# Patient Record
Sex: Male | Born: 1935 | Race: White | Hispanic: No | Marital: Single | Smoking: Never smoker
Health system: Southern US, Community
[De-identification: ages and names within clinical notes are randomized; demographics above are authoritative.]

## PROBLEM LIST (undated history)

## (undated) DIAGNOSIS — K859 Acute pancreatitis without necrosis or infection, unspecified: Secondary | ICD-10-CM

## (undated) DIAGNOSIS — I2510 Atherosclerotic heart disease of native coronary artery without angina pectoris: Secondary | ICD-10-CM

## (undated) DIAGNOSIS — I10 Essential (primary) hypertension: Secondary | ICD-10-CM

## (undated) DIAGNOSIS — E1129 Type 2 diabetes mellitus with other diabetic kidney complication: Secondary | ICD-10-CM

## (undated) DIAGNOSIS — N184 Chronic kidney disease, stage 4 (severe): Secondary | ICD-10-CM

## (undated) DIAGNOSIS — E785 Hyperlipidemia, unspecified: Secondary | ICD-10-CM

## (undated) DIAGNOSIS — D649 Anemia, unspecified: Secondary | ICD-10-CM

## (undated) HISTORY — PX: CHOLECYSTECTOMY: SHX55

## (undated) HISTORY — DX: Acute pancreatitis without necrosis or infection, unspecified: K85.90

## (undated) HISTORY — PX: ANGIOPLASTY: SHX39

## (undated) HISTORY — PX: APPENDECTOMY: SHX54

## (undated) HISTORY — DX: Essential (primary) hypertension: I10

## (undated) HISTORY — DX: Anemia, unspecified: D64.9

## (undated) HISTORY — DX: Hyperlipidemia, unspecified: E78.5

## (undated) HISTORY — DX: Atherosclerotic heart disease of native coronary artery without angina pectoris: I25.10

## (undated) HISTORY — PX: URETERAL STENT PLACEMENT: SHX822

---

## 1998-11-10 ENCOUNTER — Ambulatory Visit (HOSPITAL_COMMUNITY): Admission: RE | Admit: 1998-11-10 | Discharge: 1998-11-10 | Payer: Self-pay | Admitting: Internal Medicine

## 1999-11-09 ENCOUNTER — Ambulatory Visit (HOSPITAL_COMMUNITY): Admission: RE | Admit: 1999-11-09 | Discharge: 1999-11-09 | Payer: Self-pay | Admitting: Internal Medicine

## 2000-11-28 ENCOUNTER — Ambulatory Visit (HOSPITAL_COMMUNITY): Admission: RE | Admit: 2000-11-28 | Discharge: 2000-11-28 | Payer: Self-pay | Admitting: Internal Medicine

## 2001-11-29 ENCOUNTER — Emergency Department (HOSPITAL_COMMUNITY): Admission: EM | Admit: 2001-11-29 | Discharge: 2001-11-29 | Payer: Self-pay | Admitting: Emergency Medicine

## 2001-12-18 ENCOUNTER — Ambulatory Visit (HOSPITAL_COMMUNITY): Admission: RE | Admit: 2001-12-18 | Discharge: 2001-12-18 | Payer: Self-pay | Admitting: Internal Medicine

## 2003-01-14 ENCOUNTER — Ambulatory Visit (HOSPITAL_COMMUNITY): Admission: RE | Admit: 2003-01-14 | Discharge: 2003-01-14 | Payer: Self-pay | Admitting: Internal Medicine

## 2004-06-05 ENCOUNTER — Ambulatory Visit (HOSPITAL_COMMUNITY): Admission: RE | Admit: 2004-06-05 | Discharge: 2004-06-05 | Payer: Self-pay | Admitting: Internal Medicine

## 2004-06-19 ENCOUNTER — Inpatient Hospital Stay (HOSPITAL_COMMUNITY): Admission: EM | Admit: 2004-06-19 | Discharge: 2004-06-26 | Payer: Self-pay | Admitting: Emergency Medicine

## 2004-06-24 ENCOUNTER — Encounter (INDEPENDENT_AMBULATORY_CARE_PROVIDER_SITE_OTHER): Payer: Self-pay | Admitting: *Deleted

## 2005-06-14 ENCOUNTER — Ambulatory Visit (HOSPITAL_COMMUNITY): Admission: RE | Admit: 2005-06-14 | Discharge: 2005-06-14 | Payer: Self-pay | Admitting: Internal Medicine

## 2006-06-13 ENCOUNTER — Ambulatory Visit (HOSPITAL_COMMUNITY): Admission: RE | Admit: 2006-06-13 | Discharge: 2006-06-13 | Payer: Self-pay | Admitting: Internal Medicine

## 2008-10-13 ENCOUNTER — Emergency Department (HOSPITAL_COMMUNITY): Admission: EM | Admit: 2008-10-13 | Discharge: 2008-10-13 | Payer: Self-pay | Admitting: Emergency Medicine

## 2010-09-15 ENCOUNTER — Inpatient Hospital Stay (HOSPITAL_COMMUNITY)
Admission: EM | Admit: 2010-09-15 | Discharge: 2010-09-18 | Disposition: A | Discharge status: Home / Self Care | Payer: Self-pay | Source: Home / Self Care | Attending: Internal Medicine | Admitting: Internal Medicine

## 2010-09-15 LAB — CK TOTAL AND CKMB (NOT AT ARMC)
CK, MB: 1.1 ng/mL (ref 0.3–4.0)
Relative Index: INVALID (ref 0.0–2.5)
Total CK: 26 U/L (ref 7–232)

## 2010-09-15 LAB — DIFFERENTIAL
Basophils Absolute: 0 10*3/uL (ref 0.0–0.1)
Basophils Relative: 0 % (ref 0–1)
Eosinophils Absolute: 0 10*3/uL (ref 0.0–0.7)
Eosinophils Relative: 0 % (ref 0–5)
Lymphocytes Relative: 6 % — ABNORMAL LOW (ref 12–46)
Lymphs Abs: 0.7 10*3/uL (ref 0.7–4.0)
Monocytes Absolute: 0.6 10*3/uL (ref 0.1–1.0)
Monocytes Relative: 5 % (ref 3–12)
Neutro Abs: 10.6 10*3/uL — ABNORMAL HIGH (ref 1.7–7.7)
Neutrophils Relative %: 90 % — ABNORMAL HIGH (ref 43–77)

## 2010-09-15 LAB — COMPREHENSIVE METABOLIC PANEL
ALT: 108 U/L — ABNORMAL HIGH (ref 0–53)
AST: 171 U/L — ABNORMAL HIGH (ref 0–37)
Albumin: 3.9 g/dL (ref 3.5–5.2)
Alkaline Phosphatase: 147 U/L — ABNORMAL HIGH (ref 39–117)
BUN: 21 mg/dL (ref 6–23)
CO2: 26 mEq/L (ref 19–32)
Calcium: 9 mg/dL (ref 8.4–10.5)
Chloride: 100 mEq/L (ref 96–112)
Creatinine, Ser: 1.38 mg/dL (ref 0.4–1.5)
GFR calc Af Amer: 59 mL/min — ABNORMAL LOW (ref >60)
GFR calc non Af Amer: 49 mL/min — ABNORMAL LOW (ref >60)
Glucose, Bld: 245 mg/dL — ABNORMAL HIGH (ref 70–99)
Potassium: 4.2 mEq/L (ref 3.5–5.1)
Sodium: 136 mEq/L (ref 135–145)
Total Bilirubin: 0.8 mg/dL (ref 0.3–1.2)
Total Protein: 8.1 g/dL (ref 6.0–8.3)

## 2010-09-15 LAB — HEMOGLOBIN A1C
Hgb A1c MFr Bld: 5.7 % — ABNORMAL HIGH (ref <5.7)
Mean Plasma Glucose: 117 mg/dL — ABNORMAL HIGH (ref <117)

## 2010-09-15 LAB — PHOSPHORUS: Phosphorus: 3 mg/dL (ref 2.3–4.6)

## 2010-09-15 LAB — LIPID PANEL
Cholesterol: 93 mg/dL (ref 0–200)
HDL: 44 mg/dL (ref >39)
LDL Cholesterol: 40 mg/dL (ref 0–99)
Total CHOL/HDL Ratio: 2.1 RATIO
Triglycerides: 46 mg/dL (ref <150)
VLDL: 9 mg/dL (ref 0–40)

## 2010-09-15 LAB — CBC
HCT: 40.2 % (ref 39.0–52.0)
Hemoglobin: 13.7 g/dL (ref 13.0–17.0)
MCH: 34.7 pg — ABNORMAL HIGH (ref 26.0–34.0)
MCHC: 34.1 g/dL (ref 30.0–36.0)
MCV: 101.8 fL — ABNORMAL HIGH (ref 78.0–100.0)
Platelets: 195 10*3/uL (ref 150–400)
RBC: 3.95 MIL/uL — ABNORMAL LOW (ref 4.22–5.81)
RDW: 14.5 % (ref 11.5–15.5)
WBC: 11.9 10*3/uL — ABNORMAL HIGH (ref 4.0–10.5)

## 2010-09-15 LAB — LIPASE, BLOOD: Lipase: 1339 U/L — ABNORMAL HIGH (ref 11–59)

## 2010-09-15 LAB — TSH: TSH: 3.656 u[IU]/mL (ref 0.350–4.500)

## 2010-09-15 LAB — APTT: aPTT: 30 seconds (ref 24–37)

## 2010-09-15 LAB — POCT CARDIAC MARKERS
CKMB, poc: <1 ng/mL — ABNORMAL LOW (ref 1.0–8.0)
Myoglobin, poc: 93 ng/mL (ref 12–200)
Troponin i, poc: <0.05 ng/mL (ref 0.00–0.09)

## 2010-09-15 LAB — GLUCOSE, CAPILLARY
Glucose-Capillary: 179 mg/dL — ABNORMAL HIGH (ref 70–99)
Glucose-Capillary: 99 mg/dL (ref 70–99)
Glucose-Capillary: 68 mg/dL — ABNORMAL LOW (ref 70–99)
Glucose-Capillary: 81 mg/dL (ref 70–99)

## 2010-09-15 LAB — PROTIME-INR
INR: 1.15 (ref 0.00–1.49)
Prothrombin Time: 14.9 seconds (ref 11.6–15.2)

## 2010-09-15 LAB — MAGNESIUM: Magnesium: 2.3 mg/dL (ref 1.5–2.5)

## 2010-09-16 LAB — COMPREHENSIVE METABOLIC PANEL
Alkaline Phosphatase: 121 U/L — ABNORMAL HIGH (ref 39–117)
BUN: 13 mg/dL (ref 6–23)
CO2: 26 mEq/L (ref 19–32)
GFR calc non Af Amer: 48 mL/min — ABNORMAL LOW (ref >60)
Glucose, Bld: 99 mg/dL (ref 70–99)
Potassium: 3.9 mEq/L (ref 3.5–5.1)
Total Bilirubin: 0.8 mg/dL (ref 0.3–1.2)
Total Protein: 6.7 g/dL (ref 6.0–8.3)

## 2010-09-16 LAB — GLUCOSE, CAPILLARY
Glucose-Capillary: 105 mg/dL — ABNORMAL HIGH (ref 70–99)
Glucose-Capillary: 83 mg/dL (ref 70–99)
Glucose-Capillary: 118 mg/dL — ABNORMAL HIGH (ref 70–99)

## 2010-09-16 LAB — LIPASE, BLOOD: Lipase: 98 U/L — ABNORMAL HIGH (ref 11–59)

## 2010-09-17 LAB — COMPREHENSIVE METABOLIC PANEL
ALT: 112 U/L — ABNORMAL HIGH (ref 0–53)
Albumin: 2.7 g/dL — ABNORMAL LOW (ref 3.5–5.2)
Alkaline Phosphatase: 122 U/L — ABNORMAL HIGH (ref 39–117)
Calcium: 8.3 mg/dL — ABNORMAL LOW (ref 8.4–10.5)
GFR calc Af Amer: 52 mL/min — ABNORMAL LOW (ref >60)
Potassium: 3.8 mEq/L (ref 3.5–5.1)
Sodium: 135 mEq/L (ref 135–145)
Total Protein: 6.2 g/dL (ref 6.0–8.3)

## 2010-09-17 LAB — GLUCOSE, CAPILLARY
Glucose-Capillary: 130 mg/dL — ABNORMAL HIGH (ref 70–99)
Glucose-Capillary: 140 mg/dL — ABNORMAL HIGH (ref 70–99)
Glucose-Capillary: 157 mg/dL — ABNORMAL HIGH (ref 70–99)
Glucose-Capillary: 224 mg/dL — ABNORMAL HIGH (ref 70–99)
Glucose-Capillary: 247 mg/dL — ABNORMAL HIGH (ref 70–99)

## 2010-09-18 LAB — BASIC METABOLIC PANEL
BUN: 13 mg/dL (ref 6–23)
Chloride: 106 mEq/L (ref 96–112)
Creatinine, Ser: 1.43 mg/dL (ref 0.4–1.5)
GFR calc non Af Amer: 47 mL/min — ABNORMAL LOW (ref >60)
Glucose, Bld: 115 mg/dL — ABNORMAL HIGH (ref 70–99)
Potassium: 3.8 mEq/L (ref 3.5–5.1)

## 2010-09-18 LAB — GLUCOSE, CAPILLARY
Glucose-Capillary: 126 mg/dL — ABNORMAL HIGH (ref 70–99)
Glucose-Capillary: 154 mg/dL — ABNORMAL HIGH (ref 70–99)

## 2010-09-26 NOTE — Discharge Summary (Signed)
NAMECALYB, [name] Flynn             ACCOUNT NO.:  [id number]  MEDICAL RECORD NO.:  [id number]          PATIENT TYPE:  INP  LOCATION:  1517                         FACILITY:  [facility]  PHYSICIAN:  Clydia Llano, MD       DATE OF BIRTH:  1924/04/21  DATE OF ADMISSION:  09/15/2010 DATE OF DISCHARGE:                              DISCHARGE SUMMARY   PRIMARY CARE PHYSICIAN:  [name], M.D.  REASON FOR ADMISSION:  Abdominal pain.  DISCHARGE DIAGNOSES: 1. Acute pancreatitis. 2. Left renal mass, probably transitional cell carcinoma. 3. Hypertension. 4. Diabetes mellitus. 5. Gouty arthritis. 6. Hyperlipidemia. 7. Status post cholecystectomy secondary to gallstones in 2005.  RADIOLOGY: 1. CT abdomen and pelvis, January 28, showed findings compatible with     acute pancreatitis.  Abnormal appearance of the left renal     collecting system with abnormal soft tissue tumor which had to be     considered malignancy until tell proven otherwise.  The     differential diagnoses are transitional cell carcinoma, renal cell     carcinoma and lymphoma are felt to be less likely. 2. Chest x-ray showed hyperexpansion without acute findings.  DISCHARGE MEDICATIONS: 1. Allopurinol 300 mg p.o. daily. 2. Aspirin enteric coated 325 mg every other day. 3. Atenolol 50 mg p.o. daily. 4. Colcrys 0.6 mg p.o. daily. 5. Crestor 10 mg p.o. daily. 6. Glimepiride 2 mg p.o. daily. 7. Metamucil powder 1 tablespoon p.o. daily as needed for     constipation. 8. Multivitamin 1 tablet p.o. daily.  CONSULTS:  [facility], [name], MD  BRIEF HISTORY EXAMINATION:  [name] Flynn is 77 year old gentleman with past medical history of acute pancreatitis in 2005, which was thought to be secondary to gallstones.  He had cholecystectomy in November 2005. The patient was doing fairly well until the day of admission when he had episode of sharp abdominal pain.  He thought this was related to him straining his  muscle when he had to pick his wife off the floor.  The patient lives in independent living facility at [facility].  His abdominal pain was getting worse and patient developed nausea, vomiting. The patient was brought to the emergency department for further evaluation.  His lipase was noted to be more than 1300.  Initial evaluation done by CT scan showed acute pancreatitis.  No evidence of CBD stones.  BRIEF HOSPITAL COURSE: 1. Acute pancreatitis.  The patient admitted to the hospital, was     placed on bowel rest as n.p.o.  Aggressive IV fluid hydration was     initiated.  His lipase went down nicely to 22, and his symptoms got     much better with no nausea, vomiting or abdominal pain.  Started on     clear liquids which advanced as he tolerated to solid diet.  The     patient on day of discharge was eating solid diet without any     problems.  The patient's CT scan did not show common bile duct     stones.  The patient has previous cholecystectomy.  The patient     does not  drink alcohol.  His triglycerides are 46.  Calcium is 8.3     with corrected calcium around 9.5.  Does not have any medication in     his medication profile that causes pancreatitis.  This bout of     pancreatitis is considered to be idiopathic acute pancreatitis. 2. Left renal mass, probably transitional cell carcinoma.  Urology was     called.  Dr. Laverle Patter saw the patient and asked him to follow up as     outpatient in his office. 3. Diabetes mellitus type 2.  The patient on glyburide at home.  Seems     very controlled with hemoglobin A1c 5.7.  Had no issues during the     hospitalization. 4. Hypertension, controlled.  His atenolol was continued throughout     the hospital stay. 5. Elevated LFTs.  Alkaline phosphatase 122, AST 67, ALT is 112.  This     is his LFTs from the day of discharge.  Last time, he had been     admitted to the hospital because of pancreatitis in 2005, thought     to have biliary  pancreatitis, ended up with cholecystectomy.  I     think the elevated LFTs are secondary to medication, mainly     allopurinol and colchicine.  The patient has no symptoms.  It seems     like it is elevated for as far as I can go back in the medical     records here.  DISCHARGE INSTRUCTIONS: 1. Diet, carbohydrate-modified diet. 2. Activity as tolerated. 3. Disposition home and the assisted living facility.     Clydia Llano, MD     ME/MEDQ  D:  09/18/2010  T:  09/18/2010  Job:  [id number]  cc:   [name], M.D. Fax: [phone]  [name], MD Fax: [phone]  Electronically Signed by Clydia Llano  on 09/26/2010 06:49:32 PM

## 2010-09-29 NOTE — H&P (Signed)
[name] Flynn, [name] Flynn             ACCOUNT NO.:  [id number]  MEDICAL RECORD NO.:  [id number]          PATIENT TYPE:  EMS  LOCATION:  ED                           FACILITY:  [facility]  PHYSICIAN:  Michiel Cowboy, MDDATE OF BIRTH:  [birth year]  DATE OF ADMISSION:  09/15/2010 DATE OF DISCHARGE:                             HISTORY & PHYSICAL   PRIMARY CARE PROVIDER:  [name], M.D.  CHIEF COMPLAINT:  Abdominal pain.  HISTORY:  The patient is an 77 year old gentleman with past history of pancreatitis, which thought to be gallstone related status post cholecystectomy in 2005.  He was doing fairly well up until today when he had an episode of sharp onset of abdominal pain.  He thought this was related to him straining his muscles when he had to pick up his wife off floor.  She has history of dementia and has fallen today, but the pain persisted and he developed nausea and vomiting and the pain was significant, in which point he called for help.  He residing in independent living facility at [facility].  He was brought into the emergency department.  His lipase was noted to be elevated at 1300.  CT scan is still currently pending but the [facility] hospitalist was called for an admission for presumed diagnosis of pancreatitis.  The patient denies any chest pain or shortness of breath per se.  No fevers, no chills.  He does endorse nausea and vomiting but  currently feels much better.  The pain is relieved.  PAST MEDICAL HISTORY:  Significant for; 1. Diabetes. 2. Gout. 3. Hyperlipidemia. 4. Hypertension. 5. Status post cholecystectomy secondary to gallstone or early     pancreatitis in 2005.  SOCIAL HISTORY:  The patient never smoked.  Drinks rarely.  Does not abuse drugs.  Lives at the independent living facility at [facility].  FAMILY HISTORY:  Noncontributory.  ALLERGIES:  None.  MEDICATIONS:  He is not quite sure but he thinks he takes atenolol 50 mg daily; Crestor,  unknown dose daily; aspirin 325 mg every other day; maybe a gout medicine he is not sure of; and glimepiride, he may think it is the lowest dose but he cannot remember the dose.  PHYSICAL EXAMINATION:  VITAL SIGN:  Temperature 97.7, blood pressure 210/95 and now 184/90, pulse 70, respirations 18, satting 95% on room air. GENERAL:  The patient appears to be in no acute distress. HEENT:  Head nontraumatic.  Moist mucous membranes. LUNGS:  Clear to auscultation bilaterally. HEART:  Regular rhythm with no murmurs appreciated. ABDOMEN:  There is still some epigastric tenderness present.  No right upper quadrant tenderness. LOWER EXTREMITIES:  Without clubbing, cyanosis, or edema. NEUROLOGIC:  Grossly intact. SKIN:  Clean, dry, and intact.  LABORATORY DATA:  Labs show white blood cell count 11.9.  Sodium 136, potassium 4.2, creatinine 1.4, alk phos 147, AST 171, ALT 108.  Calcium 9.0, lipase 1300.  Cardiac enzymes unremarkable.  Chest x-ray unremarkable.  EKG showing heart rate of 66, no evidence of ischemia.  ASSESSMENT/PLAN: 1. This is an 77 year old gentleman with likely pancreatitis, but also     some degree of  elevated transaminitis.  We are waiting currently     results of the CT scan of his abdomen and to evaluate if there is     any evidence of obstruction.  For now, will give IV fluids, bowel     rest.  He is already status post cholecystectomy.  If raises stone     obstructing his pancreas once, he may need to have a GI consult.     We will watch for any signs of necrotic pancreatitis.  At this     point, he does not appear to be toxic. 2. Hypertension.  This is severe likely related pain.  Will give     hydralazine p.r.n. and continue atenolol. 3. Diabetes mellitus.  Will put on sliding scale.  Hold off on p.o.     meds for now. 4. Prophylaxis.  Write for [employer] and SCDs.  CODE STATUS:  The patient wished to be full code.     Michiel Cowboy, MD     AVD/MEDQ   D:  09/15/2010  T:  09/15/2010  Job:  [id number]  cc:   [name], M.D. Fax: [phone]  Electronically Signed by Therisa Doyne MD on 09/29/2010 07:25:09 PM

## 2010-10-02 ENCOUNTER — Other Ambulatory Visit (HOSPITAL_COMMUNITY): Payer: Self-pay | Admitting: Urology

## 2010-10-02 ENCOUNTER — Ambulatory Visit (HOSPITAL_COMMUNITY)
Admission: RE | Admit: 2010-10-02 | Discharge: 2010-10-02 | Disposition: A | Discharge status: Home / Self Care | Payer: Medicare Other | Source: Ambulatory Visit | Attending: Urology | Admitting: Urology

## 2010-10-02 DIAGNOSIS — I77810 Thoracic aortic ectasia: Secondary | ICD-10-CM | POA: Insufficient documentation

## 2010-10-02 DIAGNOSIS — D499 Neoplasm of unspecified behavior of unspecified site: Secondary | ICD-10-CM

## 2010-10-02 DIAGNOSIS — D4959 Neoplasm of unspecified behavior of other genitourinary organ: Secondary | ICD-10-CM | POA: Insufficient documentation

## 2010-10-15 ENCOUNTER — Other Ambulatory Visit: Payer: Self-pay | Admitting: Urology

## 2010-10-15 ENCOUNTER — Other Ambulatory Visit (HOSPITAL_COMMUNITY): Payer: Medicare Other

## 2010-10-15 ENCOUNTER — Encounter (HOSPITAL_COMMUNITY): Payer: Medicare Other | Attending: Urology

## 2010-10-15 DIAGNOSIS — Z01812 Encounter for preprocedural laboratory examination: Secondary | ICD-10-CM | POA: Insufficient documentation

## 2010-10-15 LAB — BASIC METABOLIC PANEL
CO2: 28 mEq/L (ref 19–32)
Chloride: 105 mEq/L (ref 96–112)
GFR calc Af Amer: 55 mL/min — ABNORMAL LOW (ref >60)
Glucose, Bld: 112 mg/dL — ABNORMAL HIGH (ref 70–99)
Sodium: 140 mEq/L (ref 135–145)

## 2010-10-15 LAB — CBC
HCT: 37.7 % — ABNORMAL LOW (ref 39.0–52.0)
Hemoglobin: 12.3 g/dL — ABNORMAL LOW (ref 13.0–17.0)
MCHC: 32.6 g/dL (ref 30.0–36.0)
RBC: 3.61 MIL/uL — ABNORMAL LOW (ref 4.22–5.81)

## 2010-10-15 LAB — SURGICAL PCR SCREEN: MRSA, PCR: NEGATIVE

## 2010-10-19 ENCOUNTER — Ambulatory Visit (HOSPITAL_COMMUNITY)
Admission: RE | Admit: 2010-10-19 | Discharge: 2010-10-19 | Disposition: A | Discharge status: Home / Self Care | Payer: Medicare Other | Source: Ambulatory Visit | Attending: Urology | Admitting: Urology

## 2010-10-19 DIAGNOSIS — N2889 Other specified disorders of kidney and ureter: Secondary | ICD-10-CM

## 2010-10-19 DIAGNOSIS — Z9861 Coronary angioplasty status: Secondary | ICD-10-CM | POA: Insufficient documentation

## 2010-10-19 DIAGNOSIS — E119 Type 2 diabetes mellitus without complications: Secondary | ICD-10-CM | POA: Insufficient documentation

## 2010-10-19 DIAGNOSIS — I10 Essential (primary) hypertension: Secondary | ICD-10-CM | POA: Insufficient documentation

## 2010-10-19 DIAGNOSIS — Z01812 Encounter for preprocedural laboratory examination: Secondary | ICD-10-CM | POA: Insufficient documentation

## 2010-10-19 DIAGNOSIS — I2510 Atherosclerotic heart disease of native coronary artery without angina pectoris: Secondary | ICD-10-CM | POA: Insufficient documentation

## 2010-10-19 DIAGNOSIS — D4989 Neoplasm of unspecified behavior of other specified sites: Secondary | ICD-10-CM | POA: Insufficient documentation

## 2010-10-19 DIAGNOSIS — Z79899 Other long term (current) drug therapy: Secondary | ICD-10-CM | POA: Insufficient documentation

## 2010-10-19 LAB — GLUCOSE, CAPILLARY
Glucose-Capillary: 74 mg/dL (ref 70–99)
Glucose-Capillary: 96 mg/dL (ref 70–99)

## 2010-10-20 NOTE — Op Note (Signed)
NAME:  [name] Flynn, [name] Flynn             ACCOUNT NO.:  [id number]  MEDICAL RECORD NO.:  [id number]           PATIENT TYPE:  O  LOCATION:  DAYL                         FACILITY:  [facility]  PHYSICIAN:  [name], MD      DATE OF BIRTH:  09-14-1923  DATE OF PROCEDURE:  10/19/2010 DATE OF DISCHARGE:                              OPERATIVE REPORT   PREOPERATIVE DIAGNOSIS:  Left renal pelvic neoplasm.  POSTOPERATIVE DIAGNOSIS:  Left extrarenal pelvic neoplasm.  SURGEON:  [name], M.D.  ANESTHESIA:  General.  COMPLICATIONS:  None.  PROCEDURES: 1. Cystoscopy. 2. Bilateral retrograde pyelography with interpretation. 3. Left ureteroscopy. 4. Left ureteral stent placement (6 x 24).  INTRAOPERATIVE FINDINGS:  Bilateral retrograde pyelography demonstrated normal caliber ureters without dilation of the renal pelvis or caliceal systems bilaterally.  No definite filling defects were identified within the renal pelvis or ureter bilaterally.  INDICATION:  [name] Flynn is an 77 year old gentleman who recently was hospitalized with pancreatitis and was found to have an incidentally detected hyperdense renal pelvic mass on the left side concerning for a possible urothelial malignancy.  He presents today to undergo further evaluation and endoscopic evaluation.  The above procedures were recommended and he consented after discussion regarding the potential risks, complications, and alternative treatment options.  DESCRIPTION OF PROCEDURE:  The patient was taken to the operating room, and a general anesthetic was administered.  He was given preoperative antibiotics, placed in the dorsal lithotomy position, and prepped and draped in the usual sterile fashion.  Next, a preoperative time-out was performed.  Cystourethroscopy was performed which revealed a normal anterior urethra.  The prostatic urethra was suggestive of benign prostatic hyperplasia with coapting bilateral lateral lobes and  some friability to the prostate.  Inspection of the bladder systematically revealed no evidence of any bladder tumors, stones, or other mucosal pathology.  There was mild trabeculation throughout the bladder.  A 6- [place] ureteral catheter was then used to intubate the right ureteral orifice, and Omnipaque contrast was injected.  Findings are as dictated above with no identifiable abnormalities.  The left ureteral orifice was then cannulated with the 6-French catheter and again Omnipaque contrast was injected and again no definite abnormalities were identified.  There initially were some filling defects seen in the mid ureter which were confirmed to be air bubbles.  Due to the concerning findings on the CT scan, a 0.038 Sensor guidewire was advanced up into the left renal pelvis under fluoroscopic guidance.  An attempt to place the digital flexible ureteroscope over the wire was unsuccessful as it could not be passed into the distal ureter.  Therefore, a 12/14 ureteral access sheath was advanced over the wire up into the proximal ureter and the digital flexible ureteroscope was advanced up into the renal pelvis through the access sheath.  An inspection of the entire renal collecting system revealed no evidence of any abnormalities and specifically, no urothelial tumors or abnormalities.  Therefore, the wire was replaced and the scope was gradually removed down the ureter and the entire ureter was removed as the ureteral access sheath was withdrawn.  No ureteral abnormalities were also identified.  The wire was then back loaded on the cystoscope and a 6 x 24 double-J ureteral stent was advanced over the wire using Seldinger technique.  It was positioned appropriately under fluoroscopic and cystoscopic guidance and the wire was removed with a good curl noted in the renal pelvis as well as in the bladder.  The patient's bladder was emptied.  He tolerated the procedure well and without  complications.  He was able to be extubated and transferred to the recovery unit in satisfactory condition.  ADDENDUM:  I reviewed the patient's CT scan with Dr. Richarda Flynn following the procedure today.  We discussed the feasibility of a percutaneous biopsy which he did think would be feasible.  The patient will plan to keep his ureteral stent in place until his biopsy is performed and then we will plan to remove this.     [name], MD     LB/MEDQ  D:  10/19/2010  T:  10/19/2010  Job:  [id number]  Electronically Signed by [name] MD on 10/20/2010 08:06:16 AM

## 2010-10-22 ENCOUNTER — Other Ambulatory Visit (HOSPITAL_COMMUNITY): Payer: Medicare Other

## 2010-10-22 ENCOUNTER — Other Ambulatory Visit (HOSPITAL_COMMUNITY): Payer: Self-pay | Admitting: Urology

## 2010-10-22 DIAGNOSIS — N2889 Other specified disorders of kidney and ureter: Secondary | ICD-10-CM

## 2010-10-24 ENCOUNTER — Other Ambulatory Visit: Payer: Self-pay | Admitting: Urology

## 2010-10-24 ENCOUNTER — Ambulatory Visit (HOSPITAL_COMMUNITY)
Admission: RE | Admit: 2010-10-24 | Discharge: 2010-10-24 | Disposition: A | Discharge status: Home / Self Care | Payer: Medicare Other | Source: Ambulatory Visit | Attending: Urology | Admitting: Urology

## 2010-10-24 ENCOUNTER — Other Ambulatory Visit (HOSPITAL_COMMUNITY): Payer: Medicare Other

## 2010-10-24 ENCOUNTER — Ambulatory Visit (HOSPITAL_COMMUNITY): Payer: Medicare Other

## 2010-10-24 ENCOUNTER — Other Ambulatory Visit: Payer: Self-pay | Admitting: Diagnostic Radiology

## 2010-10-24 ENCOUNTER — Other Ambulatory Visit: Payer: Self-pay

## 2010-10-24 DIAGNOSIS — N289 Disorder of kidney and ureter, unspecified: Secondary | ICD-10-CM | POA: Insufficient documentation

## 2010-10-24 DIAGNOSIS — N2889 Other specified disorders of kidney and ureter: Secondary | ICD-10-CM

## 2010-10-24 DIAGNOSIS — Y849 Medical procedure, unspecified as the cause of abnormal reaction of the patient, or of later complication, without mention of misadventure at the time of the procedure: Secondary | ICD-10-CM | POA: Insufficient documentation

## 2010-10-24 DIAGNOSIS — IMO0002 Reserved for concepts with insufficient information to code with codable children: Secondary | ICD-10-CM | POA: Insufficient documentation

## 2010-10-24 LAB — GLUCOSE, CAPILLARY: Glucose-Capillary: 99 mg/dL (ref 70–99)

## 2010-10-24 LAB — CBC
Hemoglobin: 11.5 g/dL — ABNORMAL LOW (ref 13.0–17.0)
MCH: 33.7 pg (ref 26.0–34.0)
RBC: 3.41 MIL/uL — ABNORMAL LOW (ref 4.22–5.81)

## 2010-10-24 LAB — PROTIME-INR: Prothrombin Time: 14.4 seconds (ref 11.6–15.2)

## 2010-12-04 LAB — DIFFERENTIAL
Lymphs Abs: 0.7 10*3/uL (ref 0.7–4.0)
Monocytes Relative: 3 % (ref 3–12)
Neutro Abs: 11.2 10*3/uL — ABNORMAL HIGH (ref 1.7–7.7)
Neutrophils Relative %: 91 % — ABNORMAL HIGH (ref 43–77)

## 2010-12-04 LAB — BASIC METABOLIC PANEL
Calcium: 8.7 mg/dL (ref 8.4–10.5)
Chloride: 99 mEq/L (ref 96–112)
Creatinine, Ser: 1.39 mg/dL (ref 0.4–1.5)
GFR calc Af Amer: 59 mL/min — ABNORMAL LOW (ref >60)

## 2010-12-04 LAB — RAPID STREP SCREEN (MED CTR MEBANE ONLY): Streptococcus, Group A Screen (Direct): NEGATIVE

## 2010-12-04 LAB — URINE MICROSCOPIC-ADD ON

## 2010-12-04 LAB — URINALYSIS, ROUTINE W REFLEX MICROSCOPIC
Glucose, UA: NEGATIVE mg/dL
Specific Gravity, Urine: 1.019 (ref 1.005–1.030)
pH: 6 (ref 5.0–8.0)

## 2010-12-04 LAB — CBC
MCV: 101.2 fL — ABNORMAL HIGH (ref 78.0–100.0)
RBC: 3.83 MIL/uL — ABNORMAL LOW (ref 4.22–5.81)
WBC: 12.3 10*3/uL — ABNORMAL HIGH (ref 4.0–10.5)

## 2011-01-04 NOTE — Op Note (Signed)
NAMECORRAN, LALONE             ACCOUNT NO.:  [id number]   MEDICAL RECORD NO.:  [id number]          PATIENT TYPE:  INP   LOCATION:  3040                         FACILITY:  [facility]   PHYSICIAN:  Jimmye Norman III, M.D.  DATE OF BIRTH:  08-11-1924   DATE OF PROCEDURE:  DATE OF DISCHARGE:                                 OPERATIVE REPORT   PREOPERATIVE DIAGNOSIS:  Gallstones pancreatitis.   POSTOPERATIVE DIAGNOSIS:  Gallstones pancreatitis.   PROCEDURE:  Laparoscopic cholecystectomy with cholangiogram, lysis of  extensive adhesions.   SURGEON:  Jimmye Norman, M.D.   ASSISTANT:  Abigail Miyamoto, M.D.   ANESTHESIA:  General endotracheal.   ESTIMATED BLOOD LOSS:  50-100 cc.   COMPLICATIONS:  None.   CONDITION:  Stable.   FINDINGS:  The patient had extensive omental adhesions to the anterior  abdominal wall, making it initially difficult to get into the peritoneal  space for the gallbladder.  Once these adhesions were taken down, the  cholecystectomy was taken down routinely.   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   DESCRIPTION OF PROCEDURE:  The patient was taken to the operating room,  placed on the table in the  supine position. After an adequate endotracheal  anesthetic was administered, he was prepped and draped in the usual sterile  manner, exposing the midline and the right upper quadrant.  The patient had  a right paramedian incision for an appendectomy when he was 77 years old.   We made a supraumbilical incision using a #15 blade and subsequently, passed  a Veress needle into the peritoneal cavity.  It was confirmed to be in  position with the saline test.  Once this was done, carbon dioxide  insufflation was instilled into the peritoneal cavity, up to a maximum intra-  abdominal pressure of 15 mmHg.  We then used an Opti-Vu 11-12 mm  cannula,  passing through this supraumbilical space into the peritoneal cavity, and  confirmed its intra-abdominal position using a laparoscope with attached  camera and light source.  However, it could be seen that the omentum was  plastered up towards the anterior abdominal wall, and we were looking at the  underside at the hepatic flexure of the colon.  We were able to dissect out  a plane between the omentum and the anterior abdominal wall, laterally just  parallel to the umbilicus and then subsequently, able to get a subxiphoid  cannula in place.  We then took down adhesions through the 5 mm cannulas  that were passed into the proper space on the right side using scissors to  take down these adhesions.  This took about 1/2 hour of time.   Once we had the omentum adequately taken down, we inspected it for  hemostasis and there was adequate hemostasis.  We placed the patient in  reverse Trendelenburg, left side was tilted down and then the dissection  begun.  The gallbladder was retracted towards the anterior abdominal wall  and right upper quadrant using a grasper  through the lateral most 5 mm  cannula.  We used a second cannula and grasper on the body and infundibulum  of the gallbladder to retract it.  The peritoneum overlying the  hepatoduodenal triangle and triangle of Calot was dissected out, exposing  the cystic duct and the cystic artery.  The proximal clip was placed on the  cystic duct and it was subsequently cut in order to place a  cholecystodochotomy through which a Cook catheter was passed with a  cholangiogram.  This was done and demonstrated good proximal flow, although  you could not see the whole proximal hepatic duct, good flow into the  duodenum along the cystic duct and a small reflux back to the pancreatic  duct.  There were no stones noted and the common duct was not dilated.   We removed the clip and the cholangiocatheter, and then subsequently doubly  clipped  the distal cystic duct.  Once this was done, we transected it and  then also transected the cystic artery which was adequately dissected out,  and then dissected out the gallbladder from its bed with minimal difficulty.  There was no entrance into the gallbladder. There was a posterior hepatic  branch into the gallbladder which was Endo-clipped.  Once the gallbladder  was removed, we obtained hemostasis using electrocautery.  We irrigated with  copious amounts of saline.  We inspected the omentum which had been taken  down from its adhesions, and there was no fresh bleeding.  We removed all  cannulas and then closed.   The supraumbilical site was closed using a figure-of-eight stitch of 0-  Vicryl passed on a UR6 needle.  Then 0.25% Marcaine was injected at all  sites. Then skin was closed using running, subcuticular stitch of 4-0  Vicryl.  All sponge counts, instrument counts and needle counts were  correct.       JW/MEDQ  D:  06/24/2004  T:  06/24/2004  Job:  [id number]

## 2011-01-04 NOTE — Consult Note (Signed)
NAME:  [name] Flynn, [name] Flynn             ACCOUNT NO.:  [id number]   MEDICAL RECORD NO.:  [id number]          PATIENT TYPE:  INP   LOCATION:  3040                         FACILITY:  [facility]   PHYSICIAN:  Althea Grimmer. Santogade, M.D.DATE OF BIRTH:  [birth year]   DATE OF CONSULTATION:  06/19/2004  DATE OF DISCHARGE:                                   CONSULTATION   REASON FOR CONSULTATION:  Mr. Shill is a 77 year old male whom I am asked  to see by Dr. Mikeal Hawthorne for acute pancreatitis.  He apparently presented to his  primary caregiver yesterday complaining of acute onset of epigastric pain  associated with nausea and vomiting.  CT scan of his abdomen demonstrated  pancreatitis.  On ultrasonogram, his common bile duct was 6.6 mm in  diameter.  There were no gallstones or common bile duct stones noted.  However, there is probable sludge within the gallbladder.  On admission,  overnight he vomited three times but he has had no vomiting since and he  currently says his abdominal pain is almost completely resolved.  His lipase  has gone from 1803 down to 764.  His amylase has gone from 1493 down to  1016.  On admission, his serum sodium is 167, BUN 12, creatinine 1.6, AST  50.  White blood count was 17.1 and is currently 16.6, hemoglobin was 15.4,  prothrombin time normal.  His alkaline phosphatase has remained normal;  however, his bilirubin has gone from 0.9 to 4 and his SGOT from 59 to 142,  and SGPT from 32 to 88.  The patient denies any dysphagia, prior episodes of  pancreatitis or known gallstones, melena, or hematochezia.  He has had no  weight loss.  He reports that Dr. Sherin Quarry did a colonoscopy on him over 5  years ago and removed a small polyp, the pathology of which is not available  to me at present.   PAST MEDICAL HISTORY:  Pertinent for hypertension, gout, and hyperlipidemia.   MEDICATIONS:  Current medications, which have not changed in many months,  are atenolol, allopurinol, Crestor,  and colchicine.   ALLERGIES:  None reported.   FAMILY HISTORY:  Father died in his 46s of multiple CVAs.  Mother died in  her 21s of sarcoidosis.   SOCIAL HISTORY:  He is a nonsmoker, married, occasionally drinks wine but  has had no alcohol in the past 2 weeks.   REVIEW OF SYSTEMS:  GENERAL:  No weight loss or night sweats.  ENDOCRINE:  No history of diabetes or thyroid problems.  METABOLIC:  Known history of  gout.  EYES:  No icterus or change in vision.  ENT:  No aphthous ulcers or  chronic sore throat.  RESPIRATORY:  No shortness of breath, cough, or  wheezing.  CARDIAC:  No chest pain, palpitations, or history of valvular  heart disease.  GI:  As above.  GU:  Mild dysuria.   PHYSICAL EXAMINATION:  GENERAL:  He is a well-developed, well-nourished  adult male appearing somewhat younger than stated age in no acute distress.  VITAL SIGNS:  Afebrile, blood pressure 137/63, pulse 64 and regular.  SKIN:  Appears normal.  HEENT:  Eyes are anicteric.  Conjunctivae pink.  Oropharynx unremarkable  without aphthous ulcers.  Mucous membranes moist.  NECK:  Supple without thyromegaly.  There is no cervical or inguinal  adenopathy.  CHEST:  Clear.  HEART:  Regular rate and rhythm.  ABDOMEN:  May be mildly distended, but bowel sounds are positive and there  is minimal epigastric tenderness to deep palpation.  RECTAL:  Not performed.  EXTREMITIES:  Without cyanosis, clubbing, edema, or rash.   IMPRESSION:  A 77 year old male who probably has pancreatitis attributed to  microlithiasis from the gallbladder.  I doubt that he has ongoing biliary  obstruction with a normal alkaline phosphatase and resolution of pain.  I  suspect his pancreatitis will rapidly resolve.   RECOMMENDATIONS:  I think he should be followed clinically, with ample  hydration as he is being done at present.  However, since he is no longer  vomiting and has bowel sounds I am clamping his NG tube, and if he is not   vomiting in the next several hours it could be discontinued.  His IV rate is  also turned down to 150 mL/hour, considering his age.  If he is well  tomorrow, the Foley could be discontinued and we could consider starting  clear fluids.  He probably should have surgical consultation to consider  cholecystectomy when recovered from pancreatitis.       PJS/MEDQ  D:  06/19/2004  T:  06/19/2004  Job:  [id number]   cc:   Lonia Blood, M.D.  Fax: [phone]   [name], M.D.  [address]  [place]  [place] [address]  Fax: [phone]   Tasia Catchings, M.D.  [address]  [place]  [place] [address]  Fax: [phone]

## 2011-01-04 NOTE — Discharge Summary (Signed)
[name] Flynn, [name] Flynn             ACCOUNT NO.:  [id number]   MEDICAL RECORD NO.:  [id number]          PATIENT TYPE:  INP   LOCATION:  3040                         FACILITY:  [facility]   PHYSICIAN:  Isidor Holts, M.D.  DATE OF BIRTH:  1924/03/19   DATE OF ADMISSION:  06/19/2004  DATE OF DISCHARGE:  06/26/2004                                 DISCHARGE SUMMARY   PRIMARY MEDICAL DOCTOR:  [name], M.D.   PRIMARY DIAGNOSES:  1.  Acute pancreatitis.  2.  Microcholelithiasis.  3.  Status post laparoscopic cholecystectomy June 24, 2004.   SECONDARY DIAGNOSES:  1.  Hypertension.  2.  Dyslipidemia.  3.  Gout.  4.  Coronary artery diseas; status post percutaneous transluminal coronary      angioplasty 1995.   DISCHARGE MEDICATIONS:  1.  Vicodin (5/500) one to two p.r.n. q.6 h. for pain.  2.  Protonix 40 mg p.o. daily for 2 weeks only.  3.  Colace 100 mg p.o. b.i.d. as needed OTC.  4.  Continue preadmission medications.   PROCEDURES:  1.  Abdominal ultrasound dated June 18, 2004.  This showed gallbladder      sludge but no stones.  Also dilated common bile duct at 6.6 mm.  2.  Abdominal/pelvic CT scan dated June 18, 2004.  Pancreas shows diffuse      edema and peripancreatic stranding consistent with acute pancreatitis.      No calcified gallstones are seen, however.  CT scan of pelvis was      negative.  3.  Chest x-ray dated June 20, 2004.  This showed linear opacities at the      bases consistent with atelectasis or scarring, also cardiomegaly.  4.  Intraoperative cholangiography June 24, 2004.  This showed normal      common bile duct.   CONSULTATIONS:  1.  Dr. Daphine Deutscher, general surgery.  2.  Dr. Jimmye Norman, general surgery.  3.  Dr. Roosvelt Harps, gastroenterology.   ADMISSION HISTORY:  Essentially as the notes of H&P dated June 19, 2004.  However, briefly, this is a 77 year old Caucasian male with a history of  hypertension and gout who presented  with epigastric pain.  He went to this  PMDs office and was referred to the emergency room for abdominal CT scan and  ultrasound.  He also had vomiting.  Workup revealed serum lipase of 1803 and  amylase of 493.  Findings on abdominal CT scan were consistent with  pancreatitis.  He was therefore admitted for further evaluation,  investigation, and treatment.   HOSPITAL COURSE:  Problem 1.  ACUTE PANCREATITIS AS CONFIRMED BY THE  PATIENT'S SYMPTOMATOLOGY AND ELEVATED SERUM LIPASE AND AMYLASE LEVELS:  The  patient's was managed initially with NPO, intravenous, analgesics.  Gastroenterologist, Dr. Luther Parody, was invited on consult, however, he  determined that ERCP was not indicated at present.  Abdominal imaging showed  evidence of acute pancreatitis and indirect evidence of cholelithiasis as  evidenced by a dilated common bile duct.  No stone was actually visualized  in the gallbladder and biliary tract.  It was deemed that the patient  may  have actually passed a common bile duct stone or he may have  microcholelithiasis.  Surgical consultation was called which was provided by  Drs. Daphine Deutscher and [place] who determined the need for cholecystectomy with  intraoperative cholangiography as soon as acute phenomena had subsided.  The  patient had a smooth clinical course with resolution of abdominal pain and  vomiting.  He was subsequently able to undergo surgery on June 24, 2004.  A laparoscopic cholecystectomy was performed.  There were no complications  both intraoperatively or postoperatively.   Problem 2.  HYPERTENSION:  This was adequately controlled initially with  intravenous hydralazine and then once oral intake was established with p.o.  beta-blocker.   Problem 3. GOUT:  No acute flare ups were noted during the patient's  hospital stay.   Problem 4.  DYSLIPIDEMIA:  Managed with statin.   Problem 5.  CORONARY ARTERY DISEASE:  Remained asymptomatic throughout  hospital stay.    DISPOSITION:  The patient was discharged in satisfactory condition on  June 26, 2004.  Activity is as tolerated; however, with no lifting.  Dietary restrictions include a low fat diet.  The patient was instructed  with regards to wound care and was instructed to call Dr. Lindie Spruce, general  surgeon for follow up appointment in 3 weeks.  He is also to follow up with  his PMD, Dr. Chilton Si, after 1 week.      Chri   CO/MEDQ  D:  07/02/2004  T:  07/02/2004  Job:  [id number]   cc:   [name], M.D.  [address]  [place]  [place] [address]  Fax: [phone]   Jimmye Norman III, M.D.  [address]  [place]  [place] [address]  Fax: [phone]   [place]. Daphine Deutscher, MD  [address], [address]  [place]  [place] [address]  Fax: [phone]   Althea Grimmer. Luther Parody, M.D.  [address], [address]  [place]  [place] [address]  Fax: [phone]

## 2011-01-04 NOTE — H&P (Signed)
NAME:  [name] Flynn, [name] Flynn             ACCOUNT NO.:  [id number]   MEDICAL RECORD NO.:  [id number]          PATIENT TYPE:  INP   LOCATION:                               FACILITY:  [facility]   PHYSICIAN:  Mobolaji B. Bakare, M.D.DATE OF BIRTH:  29-May-1924   DATE OF ADMISSION:  06/19/2004  DATE OF DISCHARGE:                                HISTORY & PHYSICAL   PRIMARY CARE PHYSICIAN:  [name], M.D.   CHIEF COMPLAINT:  Epigastric pain.   HISTORY OF PRESENTING COMPLAINT:  [name] Flynn is a 77 year old white male  with history of hypertension and gout who developed epigastric pain about  9:00 a.m. today.  He went to Dr. Thomasene Lot office and, hence, was sent to ER  for CT scan of the abdomen and ultrasound.  The concern then was AAA.  Eventually, CT scan was negative for AAA.  Ultrasound of the abdomen did  show CBT of 6.6 mm and CT abdomen suggestive of acute pancreatitis, and this  patient has been admitted.  Pain was associated with nausea and vomiting.  There was no diarrhea.  At the moment, pain is 2/10, down from 8/10.  No  associated fever or chills.  No rigors.  No chest pain.  There was some  diaphoresis at the time of maximum pain.  No cough, no palpitations.  No  dizziness.  Patient has never experienced this type of pain before.  He  denies any weight loss, anorexia.   REVIEW OF SYSTEMS:  Positive for dysuria, for burning micturition.  No  increased frequency of micturition.  No skin rash.  No headaches.  No  constipation or diarrhea.   PAST MEDICAL HISTORY:  1.  Hypertension.  2.  Gout.  3.  Hyperlipidemia.   MEDICATIONS:  1.  Atenolol 50 mg p.o. daily.  2.  Allopurinol, unsure of dose.  3.  Crestor 10 mg p.o. daily.  4.  There is a second medication for gout which patient cannot recall the      name.   ALLERGIES:  No known drug allergy.   SOCIAL HISTORY:  No smoking.  Occasionally drinks alcohol.  He drinks wine  and last time he had some wine was about two weeks  ago.   FAMILY HISTORY:  He is married.  Lives with his wife in [place] on [place]  [address]artments.  He is the primary caregiver for his wife.  No family history of  pancreatic CA.   PHYSICAL EXAMINATION:  INITIAL VITALS ON ADMISSION:  Temperature 97.2, blood  pressure of 190/83, pulse of 62, respiratory rate of 20, and 02 sats of 97%.  Blood pressure now is 132/67 with a pulse rate of 66.  GENERAL:  Patient appears comfortable.  Not in any respiratory distress.  HEENT:  Normocephalic, atraumatic head.  Pupils are reactive to light.  Not  icteric.  Not pale.  NECK:  No carotid bruit.  No thyromegaly.  No elevated JVD.  LUNGS:  Clear clinically to auscultation.  S1, S2 regular.  ABDOMEN:  Obese, mildly distended, soft.  Tender epigastrium.  No rebound.  Bowel sounds present.  No palpable organomegaly.  EXTREMITIES:  No pedal edema.  No calf tenderness.  SKIN:  Dry skin lower extremities.  No petechiae, no rash.  CNS:  No focal neurological deficits.  Cranial nerves intact.   INITIAL LABORATORY DATA:  Lipase 1803, amylase at 493.  Sodium 167,  potassium 4.7, chloride 100, CO2 of 26, glucose 150, BUN 12, creatinine 1.6,  bilirubin 0.9, alkaline phosphatase 94.  AST 50, ALT 32.  PT 12.7, INR 0.9,  PTT 28.  White cells 17.1, hematocrit 47, hemoglobin 15.4, RDW 16, platelets  276,000, neutrophils 93%.  pH 7.33, CO2 of 46, bicarb 25.   CT abdomen:  Pancreatitis.   Ultrasound abdomen:  No gallstones.  CBD 6.6 mm probably secondary to  swelling in the pancreatic head.   ASSESSMENT AND PLAN:  [name] Flynn is a 77 year old white male with history  of hypertension and gout who presents here with epigastric pain, vomiting,  elevated lipase and amylase.  Ultrasound shows CBD 6.6 mm, and CT scan of  abdomen suggestive of acute pancreatitis.   Problem 1.  Acute pancreatitis, probably secondary to stone which patient  might have passed.  Query tumor, head of pancreas.  Will admit, keep NPO for  now.  Put  NG tube, IV fluids, __________ plus 20 mEq of Kay Ciel at 200 mL  per hour.  This can later be reduced.  We will put in a Foley catheter and  monitor urinary output.  Dilaudid 1 mg per IV q. 4 h. p.r.n. for pain and  Phenergan 12.5 mg IV q. 6 h. p.r.n. for nausea and vomiting.  Patient's GI  is Dr. Haywood Lasso.  Will need GI consult.  We will obtain lipid profile to  check for triglyceride level.  We will monitor hepatic profile and check BNP  in morning.  Intravenous Protonix 40 mg daily.   Problem 2.  Hypertension.  Because the patient is NPO for now, we will hold  off atenolol, however, we will give hydralazine 5-10 mg IV q. 6 h. for  systolic blood pressure greater than 160 and discontinued when patient can  take orally.  I anticipate may be able to start oral intake in the next 24-  36 hours.   Problem 3.  History of gout.  No acute flair at this point.   Problem 4.  Hyperlipidemia.  On Zocor.   Problem 5.  Elevated AST.  This is probably related to the pancreatitis,  possible stone.   Problem 6.  Hyperglycemia.  We will monitor.  The patient does not have any  history of diabetes.   Problem 7.  Deep venous thrombosis prophylaxis.  Lovenox 40 mg subcu daily.        ___________________________________________  Cyndee Brightly Corky Downs, M.D.    MBB/MEDQ  D:  06/19/2004  T:  06/19/2004  Job:  [id number]   cc:   [name], M.D.  [address], [address]  [place]  [place] [address]  Fax: [phone]

## 2011-03-20 ENCOUNTER — Other Ambulatory Visit: Payer: Self-pay | Admitting: Oncology

## 2011-03-20 ENCOUNTER — Encounter (HOSPITAL_BASED_OUTPATIENT_CLINIC_OR_DEPARTMENT_OTHER): Payer: Medicare Other | Admitting: Oncology

## 2011-03-20 ENCOUNTER — Encounter: Payer: Medicare Other | Admitting: Oncology

## 2011-03-20 DIAGNOSIS — C8590 Non-Hodgkin lymphoma, unspecified, unspecified site: Secondary | ICD-10-CM

## 2011-03-20 DIAGNOSIS — D649 Anemia, unspecified: Secondary | ICD-10-CM

## 2011-03-20 DIAGNOSIS — C779 Secondary and unspecified malignant neoplasm of lymph node, unspecified: Secondary | ICD-10-CM

## 2011-03-20 LAB — CBC WITH DIFFERENTIAL/PLATELET
BASO%: 0.3 % (ref 0.0–2.0)
EOS%: 0.8 % (ref 0.0–7.0)
LYMPH%: 16.5 % (ref 14.0–49.0)
MCHC: 34.1 g/dL (ref 32.0–36.0)
MONO#: 0.6 10*3/uL (ref 0.1–0.9)
Platelets: 213 10*3/uL (ref 140–400)
RBC: 3.55 10*6/uL — ABNORMAL LOW (ref 4.20–5.82)
WBC: 8.2 10*3/uL (ref 4.0–10.3)
lymph#: 1.4 10*3/uL (ref 0.9–3.3)

## 2011-03-20 LAB — COMPREHENSIVE METABOLIC PANEL
ALT: 10 U/L (ref 0–53)
AST: 14 U/L (ref 0–37)
CO2: 27 mEq/L (ref 19–32)
Sodium: 141 mEq/L (ref 135–145)
Total Bilirubin: 0.3 mg/dL (ref 0.3–1.2)
Total Protein: 7.3 g/dL (ref 6.0–8.3)

## 2011-03-20 LAB — LACTATE DEHYDROGENASE: LDH: 149 U/L (ref 94–250)

## 2011-03-27 ENCOUNTER — Encounter (HOSPITAL_COMMUNITY)
Admission: RE | Admit: 2011-03-27 | Discharge: 2011-03-27 | Disposition: A | Discharge status: Home / Self Care | Payer: Medicare Other | Source: Ambulatory Visit | Attending: Oncology | Admitting: Oncology

## 2011-03-27 DIAGNOSIS — Z9089 Acquired absence of other organs: Secondary | ICD-10-CM | POA: Insufficient documentation

## 2011-03-27 DIAGNOSIS — M949 Disorder of cartilage, unspecified: Secondary | ICD-10-CM | POA: Insufficient documentation

## 2011-03-27 DIAGNOSIS — M899 Disorder of bone, unspecified: Secondary | ICD-10-CM | POA: Insufficient documentation

## 2011-03-27 DIAGNOSIS — I517 Cardiomegaly: Secondary | ICD-10-CM | POA: Insufficient documentation

## 2011-03-27 DIAGNOSIS — R911 Solitary pulmonary nodule: Secondary | ICD-10-CM | POA: Insufficient documentation

## 2011-03-27 DIAGNOSIS — C8590 Non-Hodgkin lymphoma, unspecified, unspecified site: Secondary | ICD-10-CM

## 2011-03-27 DIAGNOSIS — J9819 Other pulmonary collapse: Secondary | ICD-10-CM | POA: Insufficient documentation

## 2011-03-27 DIAGNOSIS — I7789 Other specified disorders of arteries and arterioles: Secondary | ICD-10-CM | POA: Insufficient documentation

## 2011-03-27 DIAGNOSIS — N400 Enlarged prostate without lower urinary tract symptoms: Secondary | ICD-10-CM | POA: Insufficient documentation

## 2011-03-27 DIAGNOSIS — M799 Soft tissue disorder, unspecified: Secondary | ICD-10-CM | POA: Insufficient documentation

## 2011-03-27 DIAGNOSIS — K5730 Diverticulosis of large intestine without perforation or abscess without bleeding: Secondary | ICD-10-CM | POA: Insufficient documentation

## 2011-03-27 DIAGNOSIS — J438 Other emphysema: Secondary | ICD-10-CM | POA: Insufficient documentation

## 2011-03-27 DIAGNOSIS — D47Z9 Other specified neoplasms of uncertain behavior of lymphoid, hematopoietic and related tissue: Secondary | ICD-10-CM | POA: Insufficient documentation

## 2011-03-27 DIAGNOSIS — R599 Enlarged lymph nodes, unspecified: Secondary | ICD-10-CM | POA: Insufficient documentation

## 2011-03-27 DIAGNOSIS — J3489 Other specified disorders of nose and nasal sinuses: Secondary | ICD-10-CM | POA: Insufficient documentation

## 2011-03-27 DIAGNOSIS — K4090 Unilateral inguinal hernia, without obstruction or gangrene, not specified as recurrent: Secondary | ICD-10-CM | POA: Insufficient documentation

## 2011-03-27 DIAGNOSIS — I2510 Atherosclerotic heart disease of native coronary artery without angina pectoris: Secondary | ICD-10-CM | POA: Insufficient documentation

## 2011-03-27 MED ORDER — FLUDEOXYGLUCOSE F - 18 (FDG) INJECTION
15.6000 | Freq: Once | INTRAVENOUS | Status: AC | PRN
  Administered 2011-03-27: 15.6 via INTRAVENOUS

## 2011-03-29 ENCOUNTER — Other Ambulatory Visit: Payer: Self-pay | Admitting: Oncology

## 2011-03-29 DIAGNOSIS — C8590 Non-Hodgkin lymphoma, unspecified, unspecified site: Secondary | ICD-10-CM

## 2011-05-29 ENCOUNTER — Encounter: Payer: Self-pay | Admitting: *Deleted

## 2011-06-24 ENCOUNTER — Encounter (HOSPITAL_COMMUNITY)
Admission: RE | Admit: 2011-06-24 | Discharge: 2011-06-24 | Disposition: A | Discharge status: Home / Self Care | Payer: Medicare Other | Source: Ambulatory Visit | Attending: Oncology | Admitting: Oncology

## 2011-06-24 DIAGNOSIS — C8590 Non-Hodgkin lymphoma, unspecified, unspecified site: Secondary | ICD-10-CM

## 2011-06-24 DIAGNOSIS — C8589 Other specified types of non-Hodgkin lymphoma, extranodal and solid organ sites: Secondary | ICD-10-CM | POA: Insufficient documentation

## 2011-06-24 MED ORDER — FLUDEOXYGLUCOSE F - 18 (FDG) INJECTION
18.1000 | Freq: Once | INTRAVENOUS | Status: AC | PRN
  Administered 2011-06-24: 18.1 via INTRAVENOUS

## 2011-06-25 ENCOUNTER — Other Ambulatory Visit: Payer: Medicare Other | Admitting: Lab

## 2011-07-01 ENCOUNTER — Telehealth: Payer: Self-pay | Admitting: *Deleted

## 2011-07-01 NOTE — Telephone Encounter (Signed)
Pt called and would like to know results of PET scan.    Pt's    Phone      [phone]    ;    Cell      [phone].

## 2011-07-02 ENCOUNTER — Other Ambulatory Visit: Payer: Self-pay | Admitting: Oncology

## 2011-07-02 DIAGNOSIS — R599 Enlarged lymph nodes, unspecified: Secondary | ICD-10-CM

## 2011-07-04 ENCOUNTER — Telehealth: Payer: Self-pay | Admitting: Oncology

## 2011-07-04 NOTE — Telephone Encounter (Signed)
S/w  Pt re appt for 1/17 @ 10 am.

## 2011-09-05 ENCOUNTER — Telehealth: Payer: Self-pay | Admitting: Oncology

## 2011-09-05 ENCOUNTER — Ambulatory Visit (HOSPITAL_BASED_OUTPATIENT_CLINIC_OR_DEPARTMENT_OTHER): Payer: Medicare Other | Admitting: Oncology

## 2011-09-05 ENCOUNTER — Other Ambulatory Visit (HOSPITAL_BASED_OUTPATIENT_CLINIC_OR_DEPARTMENT_OTHER): Payer: Medicare Other | Admitting: Lab

## 2011-09-05 DIAGNOSIS — N289 Disorder of kidney and ureter, unspecified: Secondary | ICD-10-CM

## 2011-09-05 DIAGNOSIS — R599 Enlarged lymph nodes, unspecified: Secondary | ICD-10-CM

## 2011-09-05 DIAGNOSIS — C779 Secondary and unspecified malignant neoplasm of lymph node, unspecified: Secondary | ICD-10-CM

## 2011-09-05 DIAGNOSIS — N2889 Other specified disorders of kidney and ureter: Secondary | ICD-10-CM

## 2011-09-05 DIAGNOSIS — D539 Nutritional anemia, unspecified: Secondary | ICD-10-CM

## 2011-09-05 DIAGNOSIS — D729 Disorder of white blood cells, unspecified: Secondary | ICD-10-CM

## 2011-09-05 LAB — CBC WITH DIFFERENTIAL/PLATELET
BASO%: 0.2 % (ref 0.0–2.0)
Basophils Absolute: 0 10*3/uL (ref 0.0–0.1)
HCT: 35 % — ABNORMAL LOW (ref 38.4–49.9)
LYMPH%: 17.8 % (ref 14.0–49.0)
MCHC: 34 g/dL (ref 32.0–36.0)
MONO#: 0.4 10*3/uL (ref 0.1–0.9)
NEUT%: 74.9 % (ref 39.0–75.0)
Platelets: 218 10*3/uL (ref 140–400)
WBC: 7 10*3/uL (ref 4.0–10.3)

## 2011-09-05 LAB — COMPREHENSIVE METABOLIC PANEL
ALT: 15 U/L (ref 0–53)
AST: 18 U/L (ref 0–37)
CO2: 27 mEq/L (ref 19–32)
Creatinine, Ser: 1.73 mg/dL — ABNORMAL HIGH (ref 0.50–1.35)
Total Bilirubin: 0.4 mg/dL (ref 0.3–1.2)

## 2011-09-05 NOTE — Progress Notes (Signed)
Hematology and Oncology Follow Up Visit  [name] Flynn [id number] 08/01/1924 78 y.o. 09/05/2011 11:00 AM  CC: [name] Purpura, MD [name], M.D.    Principle Diagnosis: An 78 year old gentleman with following the issues: 1.  Perihilar masses on the left kidney initially diagnosed in January 2012.  He initially had a 2.4 x 3.1 left renal mass that have increased in size to 3.3 x 3.3 six months later. Biopsy was not diagnostic.  2. Macrocytic Anemia.    Interim History:  [name] Flynn presents today for a follow up visit. As mentioned, he presented with renal masses with biopsy failed to prove a clear cut cancer. He is on active surveillance with imaging studies and labs. Since his last visit, he reported no new symptoms. He does not report any back pain.  He does not report any flank pain.  He is really not reporting any constitutional symptoms.  He is not reporting any fevers.  He does not report any chills.  He does not report any night sweats.  He has not reported any lymphadenopathy.  Did not report any constitutional symptoms.  Continues to live independently and actually the primary caregiver for his wife that is inflicted with dementia.  Medications: I have reviewed the patient's current medications. Current outpatient prescriptions:allopurinol (ZYLOPRIM) 300 MG tablet, Take 300 mg by mouth daily.  , Disp: , Rfl: ;  aspirin 325 MG tablet, Take 325 mg by mouth daily.  , Disp: , Rfl: ;  atenolol (TENORMIN) 50 MG tablet, Take 50 mg by mouth daily.  , Disp: , Rfl: ;  colchicine 0.6 MG tablet, Take 0.6 mg by mouth daily.  , Disp: , Rfl: ;  glimepiride (AMARYL) 2 MG tablet, Take 2 mg by mouth daily before breakfast.  , Disp: , Rfl:  rosuvastatin (CRESTOR) 10 MG tablet, Take 10 mg by mouth daily.  , Disp: , Rfl:   Allergies: No Known Allergies  Past Medical History, Surgical history, Social history, and Family History were reviewed and updated.  Review of Systems: Constitutional:   Negative for fever, chills, night sweats, anorexia, weight loss, pain. Cardiovascular: no chest pain or dyspnea on exertion Respiratory: no cough, shortness of breath, or wheezing Neurological: no TIA or stroke symptoms Dermatological: negative ENT: negative Skin: Negative. Gastrointestinal: no abdominal pain, change in bowel habits, or black or bloody stools Genito-Urinary: no dysuria, trouble voiding, or hematuria Hematological and Lymphatic: negative Breast: negative Musculoskeletal: negative Remaining ROS negative. Physical Exam: Blood pressure 138/75, pulse 65, temperature 97.3 F (36.3 C), temperature source Oral, height 5\' 6"  (1.676 m), weight 160 lb 3.2 oz (72.666 kg). ECOG: 1 General appearance: alert Head: Normocephalic, without obvious abnormality, atraumatic Neck: no adenopathy, no carotid bruit, no JVD, supple, symmetrical, trachea midline and thyroid not enlarged, symmetric, no tenderness/mass/nodules Lymph nodes: Cervical, supraclavicular, and axillary nodes normal. Heart:regular rate and rhythm, S1, S2 normal, no murmur, click, rub or gallop Lung:chest clear, no wheezing, rales, normal symmetric air entry Abdomin: soft, non-tender, without masses or organomegaly EXT:no erythema, induration, or nodules   Lab Results: Lab Results  Component Value Date   WBC 7.0 09/05/2011   HGB 11.9* 09/05/2011   HCT 35.0* 09/05/2011   MCV 100.6* 09/05/2011   PLT 218 09/05/2011     Chemistry      Component Value Date/Time   NA 141 03/20/2011 1021   K 4.5 03/20/2011 1021   CL 102 03/20/2011 1021   CO2 27 03/20/2011 1021   BUN 24* 03/20/2011 1021  CREATININE 1.62* 03/20/2011 1021      Component Value Date/Time   CALCIUM 9.0 03/20/2011 1021   ALKPHOS 70 03/20/2011 1021   AST 14 03/20/2011 1021   ALT 10 03/20/2011 1021   BILITOT 0.3 03/20/2011 1021         Impression and Plan: An 78 year old gentleman with following the issues: 1.  Perihilar masses on the left kidney initially diagnosed in  January 2012.  He initially had a 2.4 x 3.1 left renal mass that have increased in size to 3.3 x 3.3 six months later. Biopsy was not diagnostic. The plan is to repeat imaging studies in 3 months, and if the kidney masses are growing rapidly or there is an organ compromise, then will pursue an aggressive approach at that time.  2. Macrocytic Anemia. The etiology related to early MDS vs. possible myeloproliferative disorder. I will repeat SPEP and IF with QIG with the next visit. I will consider a bone marrow biopsy if this get worse.    [facility], MD 1/17/201311:00 AM

## 2011-09-05 NOTE — Telephone Encounter (Signed)
[facility] PT APPT SCHEDULE FOR April INCLUDING CT FOR 4/15

## 2011-12-02 ENCOUNTER — Other Ambulatory Visit (HOSPITAL_BASED_OUTPATIENT_CLINIC_OR_DEPARTMENT_OTHER): Payer: Medicare Other

## 2011-12-02 ENCOUNTER — Ambulatory Visit (HOSPITAL_COMMUNITY)
Admission: RE | Admit: 2011-12-02 | Discharge: 2011-12-02 | Disposition: A | Discharge status: Home / Self Care | Payer: Medicare Other | Source: Ambulatory Visit | Attending: Oncology | Admitting: Oncology

## 2011-12-02 ENCOUNTER — Other Ambulatory Visit: Payer: Self-pay | Admitting: Oncology

## 2011-12-02 DIAGNOSIS — D729 Disorder of white blood cells, unspecified: Secondary | ICD-10-CM

## 2011-12-02 DIAGNOSIS — I10 Essential (primary) hypertension: Secondary | ICD-10-CM | POA: Insufficient documentation

## 2011-12-02 DIAGNOSIS — N2889 Other specified disorders of kidney and ureter: Secondary | ICD-10-CM

## 2011-12-02 DIAGNOSIS — N400 Enlarged prostate without lower urinary tract symptoms: Secondary | ICD-10-CM | POA: Insufficient documentation

## 2011-12-02 DIAGNOSIS — M799 Soft tissue disorder, unspecified: Secondary | ICD-10-CM | POA: Insufficient documentation

## 2011-12-02 DIAGNOSIS — N289 Disorder of kidney and ureter, unspecified: Secondary | ICD-10-CM | POA: Insufficient documentation

## 2011-12-02 DIAGNOSIS — K5730 Diverticulosis of large intestine without perforation or abscess without bleeding: Secondary | ICD-10-CM | POA: Insufficient documentation

## 2011-12-02 DIAGNOSIS — M439 Deforming dorsopathy, unspecified: Secondary | ICD-10-CM | POA: Insufficient documentation

## 2011-12-02 DIAGNOSIS — K4090 Unilateral inguinal hernia, without obstruction or gangrene, not specified as recurrent: Secondary | ICD-10-CM | POA: Insufficient documentation

## 2011-12-02 DIAGNOSIS — K8689 Other specified diseases of pancreas: Secondary | ICD-10-CM | POA: Insufficient documentation

## 2011-12-02 DIAGNOSIS — E119 Type 2 diabetes mellitus without complications: Secondary | ICD-10-CM | POA: Insufficient documentation

## 2011-12-02 DIAGNOSIS — I517 Cardiomegaly: Secondary | ICD-10-CM | POA: Insufficient documentation

## 2011-12-02 DIAGNOSIS — D649 Anemia, unspecified: Secondary | ICD-10-CM

## 2011-12-02 DIAGNOSIS — D7289 Other specified disorders of white blood cells: Secondary | ICD-10-CM

## 2011-12-02 DIAGNOSIS — Z9089 Acquired absence of other organs: Secondary | ICD-10-CM | POA: Insufficient documentation

## 2011-12-02 LAB — CMP (CANCER CENTER ONLY)
BUN, Bld: 21 mg/dL (ref 7–22)
CO2: 28 mEq/L (ref 18–33)
Creat: 1.6 mg/dl — ABNORMAL HIGH (ref 0.6–1.2)
Glucose, Bld: 108 mg/dL (ref 73–118)
Total Bilirubin: 0.5 mg/dl (ref 0.20–1.60)
Total Protein: 8 g/dL (ref 6.4–8.1)

## 2011-12-02 LAB — CBC WITH DIFFERENTIAL/PLATELET
BASO%: 0.7 % (ref 0.0–2.0)
EOS%: 2.5 % (ref 0.0–7.0)
HCT: 35.3 % — ABNORMAL LOW (ref 38.4–49.9)
LYMPH%: 16.9 % (ref 14.0–49.0)
MCH: 34.5 pg — ABNORMAL HIGH (ref 27.2–33.4)
MCHC: 33.7 g/dL (ref 32.0–36.0)
MCV: 102.5 fL — ABNORMAL HIGH (ref 79.3–98.0)
MONO%: 6.9 % (ref 0.0–14.0)
NEUT%: 73 % (ref 39.0–75.0)
Platelets: 211 10*3/uL (ref 140–400)
lymph#: 1.3 10*3/uL (ref 0.9–3.3)

## 2011-12-02 MED ORDER — IOHEXOL 300 MG/ML  SOLN
80.0000 mL | Freq: Once | INTRAMUSCULAR | Status: AC | PRN
  Administered 2011-12-02: 80 mL via INTRAVENOUS

## 2011-12-04 LAB — SPEP & IFE WITH QIG
Alpha-1-Globulin: 4.8 % (ref 2.9–4.9)
Alpha-2-Globulin: 11.3 % (ref 7.1–11.8)
Beta 2: 6.1 % (ref 3.2–6.5)
Beta Globulin: 5.4 % (ref 4.7–7.2)
Gamma Globulin: 24.3 % — ABNORMAL HIGH (ref 11.1–18.8)
IgG (Immunoglobin G), Serum: 2080 mg/dL — ABNORMAL HIGH (ref 650–1600)

## 2011-12-05 ENCOUNTER — Ambulatory Visit (HOSPITAL_BASED_OUTPATIENT_CLINIC_OR_DEPARTMENT_OTHER): Payer: Medicare Other | Admitting: Oncology

## 2011-12-05 ENCOUNTER — Telehealth: Payer: Self-pay | Admitting: Oncology

## 2011-12-05 VITALS — BP 147/78 | HR 69 | Temp 97.1°F | Ht 66.0 in | Wt 158.6 lb

## 2011-12-05 DIAGNOSIS — N289 Disorder of kidney and ureter, unspecified: Secondary | ICD-10-CM

## 2011-12-05 DIAGNOSIS — D539 Nutritional anemia, unspecified: Secondary | ICD-10-CM

## 2011-12-05 DIAGNOSIS — R599 Enlarged lymph nodes, unspecified: Secondary | ICD-10-CM

## 2011-12-05 NOTE — Telephone Encounter (Signed)
[facility]v pt appt schedule for oct.  °

## 2011-12-05 NOTE — Progress Notes (Signed)
Hematology and Oncology Follow Up Visit  [name] Flynn [id number] 01/03/1924 78 y.o. 12/05/2011 9:29 AM  CC: [name] Purpura, MD [name] Flynn, M.D.    Principle Diagnosis: An 78 year old gentleman with following the issues: 1.  Perihilar masses on the left kidney initially diagnosed in January 2012.  He initially had a 2.4 x 3.1 left renal mass that have increased in size to 3.3 x 3.3 six months later. Biopsy was not diagnostic.  2. Macrocytic Anemia.    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 As mentioned, he presented with renal masses with biopsy failed to prove a clear cut cancer. He is on active surveillance with imaging studies and labs. Since his last visit, he reported no new symptoms. He does not report any back pain.  He does not report any flank pain.  He is really not reporting any constitutional symptoms.  He is not reporting any fevers.  He does not report any chills.  He does not report any night sweats.  He has not reported any lymphadenopathy.  Did not report any constitutional symptoms.  Continues to live independently without any recent hospitalizations or illnesses.   Medications: I have reviewed the patient's current medications. Current outpatient prescriptions:Multiple Vitamin (MULTIVITAMIN) tablet, Take 1 tablet by mouth daily., Disp: , Rfl: ;  allopurinol (ZYLOPRIM) 300 MG tablet, Take 300 mg by mouth daily.  , Disp: , Rfl: ;  aspirin 325 MG tablet, Take 81 mg by mouth daily. , Disp: , Rfl: ;  atenolol (TENORMIN) 50 MG tablet, Take 50 mg by mouth daily.  , Disp: , Rfl: ;  colchicine 0.6 MG tablet, Take 0.6 mg by mouth daily.  , Disp: , Rfl:  glimepiride (AMARYL) 2 MG tablet, Take 2 mg by mouth daily before breakfast.  , Disp: , Rfl: ;  rosuvastatin (CRESTOR) 10 MG tablet, Take 10 mg by mouth daily.  , Disp: , Rfl:   Allergies: No Known Allergies  Past Medical History, Surgical history, Social history, and Family History were reviewed and  updated.  Review of Systems: Constitutional:  Negative for fever, chills, night sweats, anorexia, weight loss, pain. Cardiovascular: no chest pain or dyspnea on exertion Respiratory: no cough, shortness of breath, or wheezing Neurological: no TIA or stroke symptoms Dermatological: negative ENT: negative Skin: Negative. Gastrointestinal: no abdominal pain, change in bowel habits, or black or bloody stools Genito-Urinary: no dysuria, trouble voiding, or hematuria Hematological and Lymphatic: negative Breast: negative Musculoskeletal: negative Remaining ROS negative. Physical Exam: Blood pressure 147/78, pulse 69, temperature 97.1 F (36.2 C), temperature source Oral, height 5\' 6"  (1.676 m), weight 158 lb 9.6 oz (71.94 kg). ECOG: 1 General appearance: alert Head: Normocephalic, without obvious abnormality, atraumatic Neck: no adenopathy, no carotid bruit, no JVD, supple, symmetrical, trachea midline and thyroid not enlarged, symmetric, no tenderness/mass/nodules Lymph nodes: Cervical, supraclavicular, and axillary nodes normal. Heart:regular rate and rhythm, S1, S2 normal, no murmur, click, rub or gallop Lung:chest clear, no wheezing, rales, normal symmetric air entry Abdomin: soft, non-tender, without masses or organomegaly EXT:no erythema, induration, or nodules   Lab Results: Lab Results  Component Value Date   WBC 7.8 12/02/2011   HGB 11.9* 12/02/2011   HCT 35.3* 12/02/2011   MCV 102.5* 12/02/2011   PLT 211 12/02/2011     Chemistry      Component Value Date/Time   NA 142 12/02/2011 1125   NA 138 09/05/2011 1019   K 4.5 12/02/2011 1125   K 4.9  09/05/2011 1019   CL 102 12/02/2011 1125   CL 103 09/05/2011 1019   CO2 28 12/02/2011 1125   CO2 27 09/05/2011 1019   BUN 21 12/02/2011 1125   BUN 28* 09/05/2011 1019   CREATININE 1.6* 12/02/2011 1125   CREATININE 1.73* 09/05/2011 1019      Component Value Date/Time   CALCIUM 8.5 12/02/2011 1125   CALCIUM 8.8 09/05/2011 1019   ALKPHOS 74  12/02/2011 1125   ALKPHOS 73 09/05/2011 1019   AST 19 12/02/2011 1125   AST 18 09/05/2011 1019   ALT 15 09/05/2011 1019   BILITOT 0.50 12/02/2011 1125   BILITOT 0.4 09/05/2011 1019     CT CHEST, ABDOMEN AND PELVIS WITH CONTRAST. CT ABDOMEN WITHOUT  CONTRAST  Technique: Contiguous axial images of the chest abdomen and pelvis  were obtained after IV contrast administration. Unenhanced images  of the abdomen were also performed.  Contrast: 80 ml Omnipaque-300  Comparison: 06/24/2011 PET.  CT CHEST  Findings: Lung windows demonstrate scattered calcified granulomas.  Mild subpleural reticulation, slightly lower lobe predominant.  Soft tissue windows demonstrate that a left supraclavicular node  measures 8 mm on image 3 versus 6 mm on the prior PET.  - A right axillary node measures 1.2 cm on image 14 versus 1.4 cm  on the prior.  Tortuous thoracic aorta. Mild cardiomegaly with trace pericardial  fluid, unchanged. No pleural effusion. No central pulmonary  embolism, on this non-dedicated study.  Borderline enlarged subcarinal node measures 1.3 cm on image 44  versus 1.1 cm on the prior PET. No hilar adenopathy.  A pericardial or bronchogenic cyst measures 2.7 x 2.6 cm and is  unchanged on image 34.  IMPRESSION:  1. Borderline thoracic and lower cervical adenopathy. Some nodes  are larger and some are smaller. This is indeterminate and can be  reevaluated at follow-up.  2. No acute process in the chest.  3. Mild subpleural reticulation. In this age group, nonspecific.  Possibly related to early interstitial lung disease versus normal  aging.  4. Probable pericardial cyst, as before.  5. T6 moderate compression deformity. Possibly new since  09/15/2010. No significant canal compromise. Per CMS PQRS  reporting requirements (PQRS Measure 24): Given the patient's age  of greater than 50 and the fracture site (hip, distal radius, or  spine), the patient should be tested for osteoporosis using  DXA,  and the appropriate treatment considered based on the DXA results.  CT ABDOMEN AND PELVIS  Findings: Unenhanced images demonstrate no renal calculi or  hydronephrosis.  Post contrast images demonstrate similar appearance of penetrating  ulcer versus focal dissection within the suprarenal aorta at six  o'clock position on image 38 of series 4. Nonaneurysmal infrarenal  aortic dilatation at 2.2 cm on image 66. No other significant  findings on arterial phase imaging.  Normal liver, spleen, stomach. Atrophic pancreas without acute  pancreatitis. Cholecystectomy without biliary ductal dilatation.  Normal adrenal glands.  Left greater right enhancing. Renal masses.  - Mass along the posterior aspect of the right renal pelvis  measures 2.5 x 3.8 cm on image 118 of series 6. Enlarged from 1.3  cm on the 03/07/2011. Limited by cross modality comparison, but  felt to be minimally enlarged from 3.2 x 1.5 cm on image 143 image  147 of the prior PET.  - Left perirenal mass measures 2.0 x 3.5 cm on image 109 versus 2.6  x 1.4 cm on image 146 of the prior PET.  -  More lateral left perirenal lesion measures 1.6 cm on image 115  versus 1.2 cm on image 148 of the prior PET.  Medial left perirenal enhancing soft tissue is identified on image  123 and tracks along the proximal left ureter. There is mild left-  sided caliectasis involving the upper and lower pole regions,  including on images 10-18 of series 9. This is new since  03/07/2011. No hydroureter.  No retroperitoneal or retrocrural adenopathy.  Extensive colonic diverticulosis. Normal terminal ileum. Normal  small bowel without abdominal ascites.  No pelvic sidewall adenopathy. Moderate prostatomegaly. Normal  urinary bladder. Fat containing right inguinal hernia.  Nodularity extends along the left posterior renal fascia, including  on image 131. This is new since 03/07/2011.  No significant free fluid. Remote left rib trauma. A T6  moderate  compression deformity may be new since 09/15/2010 plain film chest.  IMPRESSION:  1. Further mild progression of enhancing bilateral perirenal soft  tissue masses. Findings remain suspicious for low grade  lymphoproliferative process.  2. Development of mild left-sided caliectasis, without overt  hydronephrosis.  3. Similar appearance of the abdominal aorta, with probable  penetrating ulcer within its suprarenal segment.     Impression and Plan: An 78 year old gentleman with following the issues: 1.  Perihilar masses on the left kidney initially diagnosed in January 2012.  He initially had a 2.4 x 3.1 left renal mass that have increased slightly in the interm. Biopsy was not diagnostic. The plan is to repeat imaging studies in 12 months, and if the kidney masses are growing rapidly or there is an organ compromise, then will pursue an aggressive approach at that time. He agrees to proceed with a more conservative approach.   2. Macrocytic Anemia. The etiology related to early MDS vs. possible myeloproliferative disorder. SPEP and IF with QIG all normal and do not suggest a plasma cell disorder.     [facility], MD 4/18/20139:29 AM

## 2012-02-28 ENCOUNTER — Emergency Department (HOSPITAL_COMMUNITY)
Admission: EM | Admit: 2012-02-28 | Discharge: 2012-02-28 | Disposition: A | Discharge status: Home / Self Care | Payer: Medicare Other | Attending: Emergency Medicine | Admitting: Emergency Medicine

## 2012-02-28 ENCOUNTER — Emergency Department (HOSPITAL_COMMUNITY): Payer: Medicare Other

## 2012-02-28 ENCOUNTER — Encounter (HOSPITAL_COMMUNITY): Payer: Self-pay | Admitting: *Deleted

## 2012-02-28 DIAGNOSIS — S40019A Contusion of unspecified shoulder, initial encounter: Secondary | ICD-10-CM | POA: Insufficient documentation

## 2012-02-28 DIAGNOSIS — I10 Essential (primary) hypertension: Secondary | ICD-10-CM | POA: Insufficient documentation

## 2012-02-28 DIAGNOSIS — S42213A Unspecified displaced fracture of surgical neck of unspecified humerus, initial encounter for closed fracture: Secondary | ICD-10-CM | POA: Insufficient documentation

## 2012-02-28 DIAGNOSIS — E785 Hyperlipidemia, unspecified: Secondary | ICD-10-CM | POA: Insufficient documentation

## 2012-02-28 DIAGNOSIS — M25419 Effusion, unspecified shoulder: Secondary | ICD-10-CM | POA: Insufficient documentation

## 2012-02-28 DIAGNOSIS — Z7982 Long term (current) use of aspirin: Secondary | ICD-10-CM | POA: Insufficient documentation

## 2012-02-28 DIAGNOSIS — I2510 Atherosclerotic heart disease of native coronary artery without angina pectoris: Secondary | ICD-10-CM | POA: Insufficient documentation

## 2012-02-28 DIAGNOSIS — W010XXA Fall on same level from slipping, tripping and stumbling without subsequent striking against object, initial encounter: Secondary | ICD-10-CM | POA: Insufficient documentation

## 2012-02-28 DIAGNOSIS — E119 Type 2 diabetes mellitus without complications: Secondary | ICD-10-CM | POA: Insufficient documentation

## 2012-02-28 DIAGNOSIS — M25519 Pain in unspecified shoulder: Secondary | ICD-10-CM | POA: Insufficient documentation

## 2012-02-28 DIAGNOSIS — Z79899 Other long term (current) drug therapy: Secondary | ICD-10-CM | POA: Insufficient documentation

## 2012-02-28 MED ORDER — HYDROCODONE-ACETAMINOPHEN 5-500 MG PO TABS: 1.0000 | ORAL_TABLET | Freq: Four times a day (QID) | ORAL | Status: AC | PRN

## 2012-02-28 NOTE — ED Notes (Signed)
Pt to xray

## 2012-02-28 NOTE — ED Notes (Signed)
PA at bedside Pt alert and oriented x4. Respirations even and unlabored, bilateral symmetrical rise and fall of chest. Skin warm and dry. In no acute distress. Denies needs.   

## 2012-02-28 NOTE — ED Notes (Signed)
ZOX:[id number]<AV> Expected date:02/28/12<BR> Expected time:11:04 AM<BR> Means of arrival:Ambulance<BR> Comments:<BR> fall

## 2012-02-28 NOTE — ED Notes (Signed)
Pt back from x-ray.

## 2012-02-28 NOTE — ED Notes (Signed)
Ptar at bedside  

## 2012-02-28 NOTE — ED Notes (Signed)
[facility] Alert and oriented x4. Pt was walking to exercise class and slipped in mud. Pt reports hurting right shoulder. Some deformity noted.small bruise noted on right shoulder. 3/10 pain upon shoulder movement. Pt can wiggle fingers, bil radial pulses +2. ems did make shift splint. Pt denies hitting head. No loc. No neuro deficits. Pt has bandaid on left knee that was applied by rn at facility. No other complaints other than shoulder pain.

## 2012-02-28 NOTE — ED Notes (Signed)
Ortho tech called 

## 2012-02-28 NOTE — ED Provider Notes (Signed)
History     CSN: [id number]  Arrival date & time 02/28/12  1117   First MD Initiated Contact with Patient 02/28/12 1126      Chief Complaint  Patient presents with  . Fall  . Shoulder Injury    (Consider location/radiation/quality/duration/timing/severity/associated sxs/prior treatment) Patient is a 78 y.o. male presenting with fall. The history is provided by the patient.  Fall The accident occurred less than 1 hour ago. The fall occurred while walking. The point of impact was the right shoulder. The pain is present in the right shoulder. The pain is at a severity of 4/10. The pain is mild. He was ambulatory at the scene. Pertinent negatives include no fever, no numbness and no headaches.  Pt states he was walking and slipped on some mud on the floor. States fell onto right shoulder. States "i think it is dislocated." Denies hitting his head. No LOC. No neck pain. No pain in back, abdomen, legs. No numbness or weakness in right hand. No prior shoulder problems.   Past Medical History  Diagnosis Date  . Diabetes mellitus   . Hypertension   . Pancreatitis   . CAD (coronary artery disease)   . Hyperlipemia   . Anemia     macrocytic    Past Surgical History  Procedure Date  . Appendectomy   . Cholecystectomy   . Angioplasty   . Ureteral stent placement     Left    History reviewed. No pertinent family history.  History  Substance Use Topics  . Smoking status: Not on file  . Smokeless tobacco: Not on file  . Alcohol Use: Not on file      Review of Systems  Constitutional: Negative for fever and chills.  HENT: Negative for neck pain.   Respiratory: Negative.   Cardiovascular: Negative.   Gastrointestinal: Negative.   Musculoskeletal: Positive for joint swelling and arthralgias.  Skin: Positive for wound.  Neurological: Negative for dizziness, syncope, weakness, numbness and headaches.    Allergies  Review of patient's allergies indicates no known  allergies.  Home Medications   Current Outpatient Rx  Name Route Sig Dispense Refill  . ALLOPURINOL 300 MG PO TABS Oral Take 300 mg by mouth daily.      . ASPIRIN 325 MG PO TABS Oral Take 81 mg by mouth daily.     . ATENOLOL 50 MG PO TABS Oral Take 50 mg by mouth daily.      . COLCHICINE 0.6 MG PO TABS Oral Take 0.6 mg by mouth daily.      Marland Kitchen GLIMEPIRIDE 2 MG PO TABS Oral Take 2 mg by mouth daily before breakfast.      . ONE-DAILY MULTI VITAMINS PO TABS Oral Take 1 tablet by mouth daily.    Marland Kitchen ROSUVASTATIN CALCIUM 10 MG PO TABS Oral Take 10 mg by mouth daily.        BP 210/98  Pulse 70  Temp 97.4 F (36.3 C) (Oral)  Resp 18  SpO2 100%  Physical Exam  Nursing note and vitals reviewed. Constitutional: He is oriented to person, place, and time. He appears well-developed and well-nourished. No distress.  HENT:  Head: Normocephalic and atraumatic.  Eyes: Conjunctivae are normal.  Neck: Normal range of motion. Neck supple.       No midline cervical spine tenderness. No bruising, swelling, deformity noted of the cspine. No pain with head ROM.   Cardiovascular: Normal rate, regular rhythm and normal heart sounds.   Pulmonary/Chest: Effort normal  and breath sounds normal. No respiratory distress. He has no wheezes. He has no rales.  Abdominal: Soft. Bowel sounds are normal. He exhibits no distension. There is no tenderness.  Musculoskeletal: He exhibits tenderness.       Swelling and a small contusion noted to the right lateral shoulder. Deformity present. Pain with any ROM of the shoulder. Tender to palpation over lateral and anterior joint. FUll ROM of the elbow and wrist with no pain or tenderness.   Lymphadenopathy:    He has no cervical adenopathy.  Neurological: He is alert and oriented to person, place, and time.  Skin: Skin is warm and dry.  Psychiatric: He has a normal mood and affect.    ED Course  Procedures (including critical care time)  Pt with Right shoulder  deformity. No other injuries. He did not hit his head. No pain in head or neck. No pain in pelvis. No other injuries. He is alert and oriented. Non toxic. BP elevated, will recheck.   Dg Shoulder Right  02/28/2012  *RADIOLOGY REPORT*  Clinical Data: Larey Seat today with right shoulder pain  RIGHT SHOULDER - 2+ VIEW  Comparison: None.  Findings: There is a transverse angulated fracture of the right humeral neck - proximal humerus.  Degenerative change is present at the right [facility] joint.  The ribs that are visualized appear intact.  IMPRESSION: Transverse angulated fracture of the right humeral neck - proximal humerus.  Original Report Authenticated By: Juline Patch, M.D.   1:10 PM X-ray shows angulated fracture of right humeral neck, will get into coaptation splint, sling, follow up. BP elevated, will have pt recheck it with pcp. He is asymptomatic for it, could also be up from pain.  1. Humeral surgical neck fracture   2. Hypertension       MDM          Lottie Mussel, PA 02/28/12 1523

## 2012-02-28 NOTE — ED Provider Notes (Signed)
Medical screening examination/treatment/procedure(s) were conducted as a shared visit with non-physician practitioner(s) and myself.  I personally evaluated the patient during the encounter   [name] Bucco, MD 02/28/12 1538

## 2012-02-28 NOTE — ED Notes (Signed)
Report called to cherise, rn at [facility]

## 2012-02-28 NOTE — ED Notes (Signed)
md and PA at bedside 

## 2012-03-06 NOTE — Progress Notes (Signed)
History that computer would not allow me to enter- "Quick History." PCP, Dr Elmore Guise.  Pt stated that he has had several Treadmill test at [facility]- "just to keep a check on his heart", all were ordered by Dr Renae Fickle green.  Pt does not have a cardiologist.  Pt had a Angioplasty in 1998.  t has a left renal Mass and had kidney biopsy.  Pt has an enlarged prostate.  Pt wears glasses.  Only fall he has had in the past 6 months was this fall where he slipped.

## 2012-03-06 NOTE — Progress Notes (Addendum)
I called Dr. Ranell Patrick' office and spoke with Marchelle Folks and informed her that we have no orders.

## 2012-03-06 NOTE — H&P (Signed)
CC: right shoulder pain HPI: 78 y/o male with ground level fall several days ago injuring right shoulder. Pt c/o mild pain but continues to try and use right upper extremity with pain. Denies any other injury s/p fall PMH: hyperlipidemia, hypertension, gout, diabetes  Social: married, non smoker, non drinker Allergies: NKDA Meds: allopurinal, colcrys, aspirin, atenolol, crestor, glimepiride, multivitamin Surgical: angioplasty in mid 1990s ROS: pain with rom of right shoulder otherwise negative PE: alert and appropriate 78 y/o male in no acute distress Cervical spine shows full rom without difficulty Cranial nerves 2-12 grossly intact Right shoulder: moderate ecchymosis and edema extending down right arm No rom attempted due to known fracture X-rays: transverse right proximal humerus fracture with minimal displacement Assessment: right proximal humerus fracture Plan: surgical management of right proximal humerus fracture

## 2012-03-07 ENCOUNTER — Ambulatory Visit (HOSPITAL_COMMUNITY): Payer: Medicare Other

## 2012-03-07 ENCOUNTER — Inpatient Hospital Stay (HOSPITAL_COMMUNITY): Payer: Medicare Other

## 2012-03-07 ENCOUNTER — Inpatient Hospital Stay (HOSPITAL_COMMUNITY)
Admission: EM | Admit: 2012-03-07 | Discharge: 2012-03-09 | DRG: 494 | Disposition: A | Discharge status: Home / Self Care | Payer: Medicare Other | Source: Ambulatory Visit | Attending: Orthopedic Surgery | Admitting: Orthopedic Surgery

## 2012-03-07 ENCOUNTER — Encounter (HOSPITAL_COMMUNITY): Payer: Self-pay | Admitting: Critical Care Medicine

## 2012-03-07 ENCOUNTER — Ambulatory Visit (HOSPITAL_COMMUNITY): Payer: Medicare Other | Admitting: Critical Care Medicine

## 2012-03-07 ENCOUNTER — Encounter (HOSPITAL_COMMUNITY)
Admission: EM | Disposition: A | Discharge status: Home / Self Care | Payer: Self-pay | Source: Ambulatory Visit | Attending: Orthopedic Surgery

## 2012-03-07 DIAGNOSIS — E785 Hyperlipidemia, unspecified: Secondary | ICD-10-CM | POA: Diagnosis present

## 2012-03-07 DIAGNOSIS — R599 Enlarged lymph nodes, unspecified: Secondary | ICD-10-CM

## 2012-03-07 DIAGNOSIS — S42209A Unspecified fracture of upper end of unspecified humerus, initial encounter for closed fracture: Principal | ICD-10-CM | POA: Diagnosis present

## 2012-03-07 DIAGNOSIS — I10 Essential (primary) hypertension: Secondary | ICD-10-CM | POA: Diagnosis present

## 2012-03-07 DIAGNOSIS — Y998 Other external cause status: Secondary | ICD-10-CM

## 2012-03-07 DIAGNOSIS — W19XXXA Unspecified fall, initial encounter: Secondary | ICD-10-CM | POA: Diagnosis present

## 2012-03-07 DIAGNOSIS — E119 Type 2 diabetes mellitus without complications: Secondary | ICD-10-CM | POA: Diagnosis present

## 2012-03-07 HISTORY — PX: ORIF HUMERUS FRACTURE: SHX2126

## 2012-03-07 LAB — BASIC METABOLIC PANEL
BUN: 25 mg/dL — ABNORMAL HIGH (ref 6–23)
Calcium: 9.3 mg/dL (ref 8.4–10.5)
Creatinine, Ser: 1.51 mg/dL — ABNORMAL HIGH (ref 0.50–1.35)
GFR calc non Af Amer: 40 mL/min — ABNORMAL LOW (ref >90)
Glucose, Bld: 114 mg/dL — ABNORMAL HIGH (ref 70–99)

## 2012-03-07 LAB — CBC
HCT: 34.4 % — ABNORMAL LOW (ref 39.0–52.0)
Hemoglobin: 11.4 g/dL — ABNORMAL LOW (ref 13.0–17.0)
MCH: 33.6 pg (ref 26.0–34.0)
MCHC: 33.1 g/dL (ref 30.0–36.0)
MCV: 101.5 fL — ABNORMAL HIGH (ref 78.0–100.0)

## 2012-03-07 LAB — SURGICAL PCR SCREEN: MRSA, PCR: NEGATIVE

## 2012-03-07 SURGERY — OPEN REDUCTION INTERNAL FIXATION (ORIF) PROXIMAL HUMERUS FRACTURE
Anesthesia: General | Site: Shoulder | Laterality: Right | Wound class: Clean

## 2012-03-07 MED ORDER — HYDROCODONE-ACETAMINOPHEN 5-325 MG PO TABS
1.0000 | ORAL_TABLET | ORAL | Status: DC | PRN
  Administered 2012-03-08: 2 via ORAL
  Filled 2012-03-07: qty 1
  Filled 2012-03-07 (×2): qty 2
  Filled 2012-03-07: qty 1

## 2012-03-07 MED ORDER — SODIUM CHLORIDE 0.9 % IV SOLN
INTRAVENOUS | Status: DC
  Administered 2012-03-07: 15:00:00 via INTRAVENOUS

## 2012-03-07 MED ORDER — ONDANSETRON HCL 4 MG/2ML IJ SOLN: 4.0000 mg | Freq: Four times a day (QID) | INTRAMUSCULAR | Status: DC | PRN

## 2012-03-07 MED ORDER — ACETAMINOPHEN 650 MG RE SUPP: 650.0000 mg | Freq: Four times a day (QID) | RECTAL | Status: DC | PRN

## 2012-03-07 MED ORDER — ACETAMINOPHEN 325 MG PO TABS: 650.0000 mg | ORAL_TABLET | Freq: Four times a day (QID) | ORAL | Status: DC | PRN

## 2012-03-07 MED ORDER — ROCURONIUM BROMIDE 100 MG/10ML IV SOLN
INTRAVENOUS | Status: DC | PRN
  Administered 2012-03-07: 50 mg via INTRAVENOUS

## 2012-03-07 MED ORDER — ASPIRIN 325 MG PO TABS
81.0000 mg | ORAL_TABLET | Freq: Every day | ORAL | Status: DC
  Filled 2012-03-07: qty 0.5

## 2012-03-07 MED ORDER — OXYCODONE HCL 5 MG PO TABS: 5.0000 mg | ORAL_TABLET | Freq: Once | ORAL | Status: DC | PRN

## 2012-03-07 MED ORDER — OXYCODONE HCL 5 MG/5ML PO SOLN: 5.0000 mg | Freq: Once | ORAL | Status: DC | PRN

## 2012-03-07 MED ORDER — CEFAZOLIN SODIUM-DEXTROSE 2-3 GM-% IV SOLR
2.0000 g | INTRAVENOUS | Status: AC
  Administered 2012-03-07: 2 g via INTRAVENOUS

## 2012-03-07 MED ORDER — ATENOLOL 50 MG PO TABS
50.0000 mg | ORAL_TABLET | Freq: Every day | ORAL | Status: DC
  Administered 2012-03-08 – 2012-03-09 (×2): 50 mg via ORAL
  Filled 2012-03-07 (×2): qty 1

## 2012-03-07 MED ORDER — BUPIVACAINE HCL (PF) 0.25 % IJ SOLN
INTRAMUSCULAR | Status: DC | PRN
  Administered 2012-03-07: 15 mL

## 2012-03-07 MED ORDER — ALLOPURINOL 300 MG PO TABS
300.0000 mg | ORAL_TABLET | Freq: Every day | ORAL | Status: DC
  Administered 2012-03-08 – 2012-03-09 (×2): 300 mg via ORAL
  Filled 2012-03-07 (×2): qty 1

## 2012-03-07 MED ORDER — METOCLOPRAMIDE HCL 5 MG/ML IJ SOLN
5.0000 mg | Freq: Three times a day (TID) | INTRAMUSCULAR | Status: DC | PRN
Start: 2012-03-07 — End: 2012-03-09

## 2012-03-07 MED ORDER — ADULT MULTIVITAMIN W/MINERALS CH
1.0000 | ORAL_TABLET | Freq: Every day | ORAL | Status: DC
  Administered 2012-03-09: 1 via ORAL
  Filled 2012-03-07 (×3): qty 1

## 2012-03-07 MED ORDER — DEXAMETHASONE SODIUM PHOSPHATE 4 MG/ML IJ SOLN
INTRAMUSCULAR | Status: DC | PRN
  Administered 2012-03-07: 5 mg via INTRAVENOUS

## 2012-03-07 MED ORDER — EPHEDRINE SULFATE 50 MG/ML IJ SOLN
INTRAMUSCULAR | Status: DC | PRN
  Administered 2012-03-07: 10 mg via INTRAVENOUS
  Administered 2012-03-07: 5 mg via INTRAVENOUS

## 2012-03-07 MED ORDER — CEFAZOLIN SODIUM-DEXTROSE 2-3 GM-% IV SOLR
INTRAVENOUS | Status: AC
  Filled 2012-03-07: qty 50

## 2012-03-07 MED ORDER — GLIMEPIRIDE 2 MG PO TABS
2.0000 mg | ORAL_TABLET | Freq: Every day | ORAL | Status: DC
  Administered 2012-03-08 – 2012-03-09 (×2): 2 mg via ORAL
  Filled 2012-03-07 (×3): qty 1

## 2012-03-07 MED ORDER — MUPIROCIN 2 % EX OINT
TOPICAL_OINTMENT | CUTANEOUS | Status: AC
  Administered 2012-03-07: 1 via NASAL
  Filled 2012-03-07: qty 22

## 2012-03-07 MED ORDER — LACTATED RINGERS IV SOLN
INTRAVENOUS | Status: DC | PRN
  Administered 2012-03-07 (×2): via INTRAVENOUS

## 2012-03-07 MED ORDER — FENTANYL CITRATE 0.05 MG/ML IJ SOLN
INTRAMUSCULAR | Status: DC | PRN
  Administered 2012-03-07: 50 ug via INTRAVENOUS

## 2012-03-07 MED ORDER — METOCLOPRAMIDE HCL 5 MG/ML IJ SOLN
INTRAMUSCULAR | Status: DC | PRN
  Administered 2012-03-07: 10 mg via INTRAVENOUS

## 2012-03-07 MED ORDER — PHENOL 1.4 % MT LIQD: 1.0000 | OROMUCOSAL | Status: DC | PRN

## 2012-03-07 MED ORDER — 0.9 % SODIUM CHLORIDE (POUR BTL) OPTIME
TOPICAL | Status: DC | PRN
  Administered 2012-03-07: 1000 mL

## 2012-03-07 MED ORDER — ASPIRIN 81 MG PO CHEW
162.0000 mg | CHEWABLE_TABLET | Freq: Every day | ORAL | Status: DC
  Administered 2012-03-07 – 2012-03-08 (×2): 81 mg via ORAL
  Filled 2012-03-07 (×3): qty 2

## 2012-03-07 MED ORDER — METHOCARBAMOL 500 MG PO TABS
500.0000 mg | ORAL_TABLET | Freq: Four times a day (QID) | ORAL | Status: DC | PRN
  Administered 2012-03-08: 500 mg via ORAL
  Filled 2012-03-07: qty 1

## 2012-03-07 MED ORDER — METOCLOPRAMIDE HCL 10 MG PO TABS
5.0000 mg | ORAL_TABLET | Freq: Three times a day (TID) | ORAL | Status: DC | PRN
Start: 2012-03-07 — End: 2012-03-09

## 2012-03-07 MED ORDER — ONDANSETRON HCL 4 MG PO TABS: 4.0000 mg | ORAL_TABLET | Freq: Four times a day (QID) | ORAL | Status: DC | PRN

## 2012-03-07 MED ORDER — CEFAZOLIN SODIUM 1-5 GM-% IV SOLN
1.0000 g | Freq: Four times a day (QID) | INTRAVENOUS | Status: AC
  Administered 2012-03-07 – 2012-03-08 (×3): 1 g via INTRAVENOUS
  Filled 2012-03-07 (×3): qty 50

## 2012-03-07 MED ORDER — GLYCOPYRROLATE 0.2 MG/ML IJ SOLN
INTRAMUSCULAR | Status: DC | PRN
  Administered 2012-03-07: 0.4 mg via INTRAVENOUS

## 2012-03-07 MED ORDER — PROPOFOL 10 MG/ML IV EMUL
INTRAVENOUS | Status: DC | PRN
  Administered 2012-03-07: 150 mg via INTRAVENOUS

## 2012-03-07 MED ORDER — ONDANSETRON HCL 4 MG/2ML IJ SOLN
INTRAMUSCULAR | Status: DC | PRN
  Administered 2012-03-07: 4 mg via INTRAVENOUS

## 2012-03-07 MED ORDER — ATORVASTATIN CALCIUM 20 MG PO TABS
20.0000 mg | ORAL_TABLET | Freq: Every day | ORAL | Status: DC
  Filled 2012-03-07 (×3): qty 1

## 2012-03-07 MED ORDER — MORPHINE SULFATE 2 MG/ML IJ SOLN
1.0000 mg | INTRAMUSCULAR | Status: DC | PRN
  Administered 2012-03-07: 1 mg via INTRAVENOUS
  Filled 2012-03-07: qty 1

## 2012-03-07 MED ORDER — METOCLOPRAMIDE HCL 5 MG/ML IJ SOLN: 10.0000 mg | Freq: Once | INTRAMUSCULAR | Status: DC | PRN

## 2012-03-07 MED ORDER — MENTHOL 3 MG MT LOZG: 1.0000 | LOZENGE | OROMUCOSAL | Status: DC | PRN

## 2012-03-07 MED ORDER — LIDOCAINE HCL (CARDIAC) 20 MG/ML IV SOLN
INTRAVENOUS | Status: DC | PRN
  Administered 2012-03-07: 50 mg via INTRAVENOUS

## 2012-03-07 MED ORDER — BISACODYL 10 MG RE SUPP: 10.0000 mg | Freq: Every day | RECTAL | Status: DC | PRN

## 2012-03-07 MED ORDER — CHLORHEXIDINE GLUCONATE 4 % EX LIQD
60.0000 mL | Freq: Once | CUTANEOUS | Status: DC
Start: 2012-03-07 — End: 2012-03-07

## 2012-03-07 MED ORDER — HYDROMORPHONE HCL PF 1 MG/ML IJ SOLN: 0.2500 mg | INTRAMUSCULAR | Status: DC | PRN

## 2012-03-07 MED ORDER — ONE-DAILY MULTI VITAMINS PO TABS: 1.0000 | ORAL_TABLET | Freq: Every day | ORAL | Status: DC

## 2012-03-07 MED ORDER — MUPIROCIN 2 % EX OINT: TOPICAL_OINTMENT | Freq: Two times a day (BID) | CUTANEOUS | Status: DC

## 2012-03-07 MED ORDER — METHOCARBAMOL 100 MG/ML IJ SOLN
500.0000 mg | Freq: Four times a day (QID) | INTRAVENOUS | Status: DC | PRN
  Filled 2012-03-07: qty 5

## 2012-03-07 MED ORDER — NEOSTIGMINE METHYLSULFATE 1 MG/ML IJ SOLN
INTRAMUSCULAR | Status: DC | PRN
  Administered 2012-03-07: 3 mg via INTRAVENOUS

## 2012-03-07 MED ORDER — PHENYLEPHRINE HCL 10 MG/ML IJ SOLN
10.0000 mg | INTRAVENOUS | Status: DC | PRN
  Administered 2012-03-07: 25 ug/min via INTRAVENOUS

## 2012-03-07 MED ORDER — COLCHICINE 0.6 MG PO TABS
0.6000 mg | ORAL_TABLET | Freq: Every day | ORAL | Status: DC
  Administered 2012-03-08 – 2012-03-09 (×2): 0.6 mg via ORAL
  Filled 2012-03-07 (×2): qty 1

## 2012-03-07 MED ORDER — MIDAZOLAM HCL 5 MG/5ML IJ SOLN
INTRAMUSCULAR | Status: DC | PRN
  Administered 2012-03-07: 1 mg via INTRAVENOUS

## 2012-03-07 SURGICAL SUPPLY — 64 items
BIT DRILL 2.8X4 QC CORT (BIT) ×2 IMPLANT
BIT DRILL 4 LONG FAST STEP (BIT) ×2 IMPLANT
BIT DRILL 4 SHORT FAST STEP (BIT) ×2 IMPLANT
CLOTH BEACON ORANGE TIMEOUT ST (SAFETY) ×2 IMPLANT
DRAPE INCISE IOBAN 66X45 STRL (DRAPES) ×2 IMPLANT
DRAPE U-SHAPE 47X51 STRL (DRAPES) ×2 IMPLANT
DRSG ADAPTIC 3X8 NADH LF (GAUZE/BANDAGES/DRESSINGS) ×2 IMPLANT
DRSG EMULSION OIL 3X3 NADH (GAUZE/BANDAGES/DRESSINGS) IMPLANT
DRSG PAD ABDOMINAL 8X10 ST (GAUZE/BANDAGES/DRESSINGS) ×2 IMPLANT
DURAPREP 26ML APPLICATOR (WOUND CARE) ×2 IMPLANT
ELECT NEEDLE TIP 2.8 STRL (NEEDLE) ×2 IMPLANT
ELECT REM PT RETURN 9FT ADLT (ELECTROSURGICAL) ×2
ELECTRODE REM PT RTRN 9FT ADLT (ELECTROSURGICAL) ×1 IMPLANT
GLOVE BIOGEL PI ORTHO PRO 7.5 (GLOVE) ×1
GLOVE BIOGEL PI ORTHO PRO SZ8 (GLOVE) ×1
GLOVE ORTHO TXT STRL SZ7.5 (GLOVE) ×2 IMPLANT
GLOVE PI ORTHO PRO STRL 7.5 (GLOVE) ×1 IMPLANT
GLOVE PI ORTHO PRO STRL SZ8 (GLOVE) ×1 IMPLANT
GLOVE SURG ORTHO 8.5 STRL (GLOVE) ×2 IMPLANT
GOWN STRL NON-REIN LRG LVL3 (GOWN DISPOSABLE) IMPLANT
GOWN STRL REIN XL XLG (GOWN DISPOSABLE) ×4 IMPLANT
K-WIRE 2X5 SS THRDED S3 (WIRE) ×2
KIT BASIN OR (CUSTOM PROCEDURE TRAY) ×2 IMPLANT
KIT ROOM TURNOVER OR (KITS) ×2 IMPLANT
KWIRE 2X5 SS THRDED S3 (WIRE) ×1 IMPLANT
MANIFOLD NEPTUNE II (INSTRUMENTS) ×2 IMPLANT
NDL SUT 6 .5 CRC .975X.05 MAYO (NEEDLE) ×1 IMPLANT
NEEDLE 22X1 1/2 (OR ONLY) (NEEDLE) IMPLANT
NEEDLE MAYO TAPER (NEEDLE) ×1
NS IRRIG 1000ML POUR BTL (IV SOLUTION) ×2 IMPLANT
PACK SHOULDER (CUSTOM PROCEDURE TRAY) ×2 IMPLANT
PAD ARMBOARD 7.5X6 YLW CONV (MISCELLANEOUS) ×2 IMPLANT
PASSER SUT SWANSON 36MM LOOP (INSTRUMENTS) ×2 IMPLANT
PEG STND 4.0X30MM (Orthopedic Implant) ×2 IMPLANT
PEG STND 4.0X32.5MM (Orthopedic Implant) ×2 IMPLANT
PEG STND 4.0X35MM (Orthopedic Implant) ×2 IMPLANT
PEG STND 4.0X42.5MM (Orthopedic Implant) ×4 IMPLANT
PEG STND 4.0X50.0MM (Orthopedic Implant) ×4 IMPLANT
PEGSTD 4.0X30MM (Orthopedic Implant) ×1 IMPLANT
PEGSTD 4.0X32.5MM (Orthopedic Implant) ×1 IMPLANT
PEGSTD 4.0X35MM (Orthopedic Implant) ×1 IMPLANT
PEGSTD 4.0X42.5MM (Orthopedic Implant) ×2 IMPLANT
PEGSTD 4.0X50.0MM (Orthopedic Implant) ×2 IMPLANT
SCREW LOCK 90D ANGLED 3.8X22 (Screw) ×4 IMPLANT
SCREW LOCK 90D ANGLED 3.8X30 (Screw) ×4 IMPLANT
SCREW MULTIDIR 3.8X30 HUMRL (Screw) ×2 IMPLANT
SPONGE GAUZE 4X4 12PLY (GAUZE/BANDAGES/DRESSINGS) ×2 IMPLANT
SPONGE LAP 4X18 X RAY DECT (DISPOSABLE) ×2 IMPLANT
STAPLER VISISTAT 35W (STAPLE) ×2 IMPLANT
STRIP CLOSURE SKIN 1/2X4 (GAUZE/BANDAGES/DRESSINGS) ×2 IMPLANT
SUCTION FRAZIER TIP 10 FR DISP (SUCTIONS) ×4 IMPLANT
SUT FIBERWIRE #2 38 T-5 BLUE (SUTURE)
SUT MNCRL AB 4-0 PS2 18 (SUTURE) IMPLANT
SUT VIC AB 0 CT1 27 (SUTURE) ×1
SUT VIC AB 0 CT1 27XBRD ANBCTR (SUTURE) ×1 IMPLANT
SUT VIC AB 2-0 CT1 27 (SUTURE) ×2
SUT VIC AB 2-0 CT1 TAPERPNT 27 (SUTURE) ×2 IMPLANT
SUTURE FIBERWR #2 38 T-5 BLUE (SUTURE) IMPLANT
SYR CONTROL 10ML LL (SYRINGE) IMPLANT
TAPE CLOTH SURG 4X10 WHT LF (GAUZE/BANDAGES/DRESSINGS) ×2 IMPLANT
TOWEL OR 17X24 6PK STRL BLUE (TOWEL DISPOSABLE) ×2 IMPLANT
TOWEL OR 17X26 10 PK STRL BLUE (TOWEL DISPOSABLE) ×2 IMPLANT
WATER STERILE IRR 1000ML POUR (IV SOLUTION) IMPLANT
YANKAUER SUCT BULB TIP NO VENT (SUCTIONS) ×2 IMPLANT

## 2012-03-07 NOTE — Brief Op Note (Signed)
03/07/2012  11:06 AM  PATIENT:  [name] Flynn  78 y.o. male  PRE-OPERATIVE DIAGNOSIS:  RIGHT PROXIMAL HUMERUS FRACTURE, DISPLACED  POST-OPERATIVE DIAGNOSIS:  RIGHT PROXIMAL HUMERUS FRACTURE, DISPLACED  PROCEDURE:  Procedure(s) (LRB): OPEN REDUCTION INTERNAL FIXATION (ORIF) PROXIMAL HUMERUS FRACTURE (Right), DePuy S3 Plate  SURGEON:  Surgeon(s) and Role:    * Verlee Rossetti, MD - Primary  PHYSICIAN ASSISTANT:   ASSISTANTS: Thea Gist, Pa-C   ANESTHESIA:   general  EBL:  Total I/O In: 1000 [I.V.:1000] Out: 250 [Blood:250]  BLOOD ADMINISTERED:none  DRAINS: none   LOCAL MEDICATIONS USED:  NONE  SPECIMEN:  No Specimen  DISPOSITION OF SPECIMEN:  N/A  COUNTS:  YES  TOURNIQUET:  * No tourniquets in log *  DICTATION: .Other Dictation: Dictation Number [id number]  PLAN OF CARE: Admit to inpatient   PATIENT DISPOSITION:  PACU - hemodynamically stable.   Delay start of Pharmacological VTE agent (>24hrs) due to surgical blood loss or risk of bleeding: not applicable

## 2012-03-07 NOTE — Op Note (Signed)
[name], [name] NO.:  [id number]  MEDICAL RECORD NO.:  [id number]  LOCATION:  [id number]                        FACILITY:  [facility]  PHYSICIAN:  Almedia Balls. Ranell Patrick, M.D. DATE OF BIRTH:  [birth year]  DATE OF PROCEDURE:  03/07/2012 DATE OF DISCHARGE:                              OPERATIVE REPORT   PREOPERATIVE DIAGNOSIS:  Right displaced proximal humerus fracture.  POSTOPERATIVE DIAGNOSIS:  Right displaced proximal humerus fracture.  PROCEDURE PERFORMED:  Open reduction and internal fixation of right displaced proximal humerus fracture using DePuy S3 plate.  ATTENDING SURGEON:  Almedia Balls. Ranell Patrick, M.D.  ASSISTANT:  Donnie Coffin. Dixon, PA-C who was scrubbed during the entire procedure and necessary for satisfactory completion of surgery and retraction and visualization.  ANESTHESIA:  General anesthesia was used plus interscalene block.  ESTIMATED BLOOD LOSS:  Less than 50 mL.  FLUID:  Replaced 1000 mL crystalloid.  INSTRUMENT COUNTS:  Correct.  COMPLICATIONS:  Perioperative antibiotics were given.  INDICATIONS:  The patient is a 78 year old male, who suffered a fall injuring his right proximal humerus.  The patient has had conservative management for this injury to this point and it is about a week old. The patient was seen by me yesterday in the clinic with no appreciable evidence of healing and some displacement of the humeral shaft. Discussed with the patient that the location of this fracture was in area where the forces are typically deforming forces on the fracture site and tended to displace the fracture.  The patient is also trying to take care of his disabled wife and was doing activity with that arm.  We discussed in great detail.  All options for management to include conservative management versus surgery.  The surgery would provide for an anatomic reduction and stabilization of the fracture that would allow him more mobility at least for ADLs  probably not for heavy lifting, but would provide significant increase I believe in the healing rate.  Risks were discussed, the patient agreed to proceed with surgery.  Informed consent was obtained.  DESCRIPTION OF PROCEDURE:  After adequate level of anesthesia was achieved, the patient was positioned in the modified beach-chair position.  The right shoulder was correctly identified and sterilely prepped and draped in usual manner.  Time-out called.  Deltopectoral incision was created starting at the coracoid and extending down to the anterior humerus.  Dissection down through subcu tissues using Bovie. The cephalic vein identified, and taken laterally with the deltoid. Pectoralis was taken medially.  The deltoid was elevated off the humerus.  Fracture site was identified, no appreciable healing noted. Significant deformity and shortening was noted.  We were able to clean up the fracture site removing the granulation tissue with rongeur.  Used a Therapist, nutritional to take out soft tissue from the fracture site, and I was able to anatomically reduce the fracture.  I was able to take a DePuy S3 plate and lay them on the lateral of the humerus.  With C-arm visualization, we verified the location of the plate.  We then placed a screw in the sliding hole down on the humeral shaft.  Once we were pleased with the alignment of the fracture and the position  of the plate, we went ahead and placed a pin into the humeral head verifying its position on C-arm in multiple planes.  We then went ahead and started placing locked smooth pegs into the humeral head.  We filled all the peg holes, locking those into position and then we were pleased with that positioning and location in the anatomic reduction at the fracture site.  We ranged the shoulder fully and was nice and stable construct. We placed a remaining 2 shaft screws tightening everything down and placed locked set screws to lock the 2 distal  screws in place.  Final x- rays obtained followed by thorough irrigation in the subdeltoid and subpectoral interval.  We repaired the deltopectoral interval with 0 Vicryl suture followed by 2-0 Vicryl for subcutaneous closure and staples for skin.  Steri-Strips applied followed by a sterile dressing, 0 Vicryl for deltopectoral, 2-0 Vicryl subcutaneous, and staples for skin followed by sterile dressing and a sling.  The patient tolerated the procedure well.     Almedia Balls. Ranell Patrick, M.D.     SRN/MEDQ  D:  03/07/2012  T:  03/07/2012  Job:  [id number]

## 2012-03-07 NOTE — Transfer of Care (Signed)
Immediate Anesthesia Transfer of Care Note  Patient: [name] Flynn  Procedure(s) Performed: Procedure(s) (LRB): OPEN REDUCTION INTERNAL FIXATION (ORIF) PROXIMAL HUMERUS FRACTURE (Right)  Patient Location: PACU  Anesthesia Type: GA combined with regional for post-op pain  Level of Consciousness: awake and alert   Airway & Oxygen Therapy: Patient Spontanous Breathing and Patient connected to nasal cannula oxygen  Post-op Assessment: Report given to PACU RN, Post -op Vital signs reviewed and stable and Patient moving all extremities X 4  Post vital signs: Reviewed and stable  Complications: No apparent anesthesia complications

## 2012-03-07 NOTE — Interval H&P Note (Signed)
History and Physical Interval Note:  03/07/2012 7:39 AM  [name] Flynn  has presented today for surgery, with the diagnosis of RIGHT PROXIMAL HUMERUS FRACTURE  The various methods of treatment have been discussed with the patient and family. After consideration of risks, benefits and other options for treatment, the patient has consented to  Procedure(s) (LRB): OPEN REDUCTION INTERNAL FIXATION (ORIF) PROXIMAL HUMERUS FRACTURE (Right) as a surgical intervention .  The patient's history has been reviewed, patient examined, no change in status, stable for surgery.  I have reviewed the patient's chart and labs.  Questions were answered to the patient's satisfaction.     Joziah Dollins,STEVEN R

## 2012-03-07 NOTE — Progress Notes (Signed)
accu check 109

## 2012-03-07 NOTE — Preoperative (Signed)
Beta Blockers   Reason not to administer Beta Blockers:Not Applicable 

## 2012-03-07 NOTE — Anesthesia Procedure Notes (Addendum)
Anesthesia Regional Block:  Interscalene brachial plexus block  Pre-Anesthetic Checklist: ,, timeout performed, Correct Patient, Correct Site, Correct Laterality, Correct Procedure, Correct Position, site marked, Risks and benefits discussed,  Surgical consent,  Pre-op evaluation,  At surgeon's request and post-op pain management  Laterality: Right  Prep: chloraprep       Needles:   Needle Type: Other   (Arrow Echogenic)   Needle Length: 9cm  Needle Gauge: 21    Additional Needles:  Procedures: ultrasound guided Interscalene brachial plexus block Narrative:  Start time: 03/07/2012 7:20 AM End time: 03/07/2012 7:26 AM Injection made incrementally with aspirations every 5 mL.  Performed by: Personally  Anesthesiologist: Aldona Lento, MD  Additional Notes: Ultrasound guidance used to: id relevant anatomy, confirm needle position, local anesthetic spread, avoidance of vascular puncture. Picture saved. No complications. Block performed personally by Janetta Hora. Gelene Mink, MD    Interscalene brachial plexus block Procedure Name: Intubation Date/Time: 03/07/2012 7:49 AM Performed by: Elon Alas Pre-anesthesia Checklist: Patient identified, Timeout performed, Emergency Drugs available, Suction available and Patient being monitored Patient Re-evaluated:Patient Re-evaluated prior to inductionOxygen Delivery Method: Circle system utilized Preoxygenation: Pre-oxygenation with 100% oxygen Intubation Type: IV induction and Cricoid Pressure applied Ventilation: Oral airway inserted - appropriate to patient size and Mask ventilation without difficulty Laryngoscope Size: Mac and 4 Grade View: Grade II Tube type: Oral Tube size: 8.0 mm Number of attempts: 1 Airway Equipment and Method: Stylet Placement Confirmation: positive ETCO2 and ETT inserted through vocal cords under direct vision Secured at: 23 cm Tube secured with: Tape Dental Injury: Teeth and Oropharynx as per  pre-operative assessment

## 2012-03-07 NOTE — Anesthesia Preprocedure Evaluation (Signed)
Anesthesia Evaluation  Patient identified by MRN, date of birth, ID band Patient awake    Reviewed: Allergy & Precautions, H&P , NPO status , Patient's Chart, lab work & pertinent test results, reviewed documented beta blocker date and time   Airway Mallampati: III TM Distance: >3 FB Neck ROM: full    Dental   Pulmonary neg pulmonary ROS,  breath sounds clear to auscultation        Cardiovascular hypertension, Pt. on medications and Pt. on home beta blockers + CAD Rhythm:regular Rate:Normal     Neuro/Psych negative neurological ROS  negative psych ROS   GI/Hepatic negative GI ROS, Neg liver ROS,   Endo/Other  Type 2, Oral Hypoglycemic Agents  Renal/GU negative Renal ROS  negative genitourinary   Musculoskeletal   Abdominal   Peds  Hematology negative hematology ROS (+)   Anesthesia Other Findings See surgeon's H&P   Reproductive/Obstetrics negative OB ROS                           Anesthesia Physical Anesthesia Plan  ASA: III  Anesthesia Plan: General   Post-op Pain Management:    Induction: Intravenous  Airway Management Planned: Oral ETT  Additional Equipment:   Intra-op Plan:   Post-operative Plan: Extubation in OR  Informed Consent: I have reviewed the patients History and Physical, chart, labs and discussed the procedure including the risks, benefits and alternatives for the proposed anesthesia with the patient or authorized representative who has indicated his/her understanding and acceptance.   Dental Advisory Given  Plan Discussed with: CRNA and Surgeon  Anesthesia Plan Comments:         Anesthesia Quick Evaluation

## 2012-03-07 NOTE — Anesthesia Postprocedure Evaluation (Signed)
Anesthesia Post Note  Patient: [name] Flynn  Procedure(s) Performed: Procedure(s) (LRB): OPEN REDUCTION INTERNAL FIXATION (ORIF) PROXIMAL HUMERUS FRACTURE (Right)  Anesthesia type: general  Patient location: PACU  Post pain: Pain level controlled  Post assessment: Patient's Cardiovascular Status Stable  Last Vitals:  Filed Vitals:   03/07/12 1155  BP: 137/58  Pulse: 52  Temp:   Resp: 20    Post vital signs: Reviewed and stable  Level of consciousness: sedated  Complications: No apparent anesthesia complications

## 2012-03-07 NOTE — Progress Notes (Signed)
Wedding ring finally did come off;sent with pt

## 2012-03-08 LAB — BASIC METABOLIC PANEL
BUN: 29 mg/dL — ABNORMAL HIGH (ref 6–23)
CO2: 25 mEq/L (ref 19–32)
Calcium: 8.4 mg/dL (ref 8.4–10.5)
Creatinine, Ser: 1.53 mg/dL — ABNORMAL HIGH (ref 0.50–1.35)
GFR calc non Af Amer: 40 mL/min — ABNORMAL LOW (ref >90)
Glucose, Bld: 144 mg/dL — ABNORMAL HIGH (ref 70–99)

## 2012-03-08 LAB — CBC
HCT: 27.5 % — ABNORMAL LOW (ref 39.0–52.0)
Hemoglobin: 9 g/dL — ABNORMAL LOW (ref 13.0–17.0)
MCH: 33.2 pg (ref 26.0–34.0)
MCHC: 32.7 g/dL (ref 30.0–36.0)
MCV: 101.5 fL — ABNORMAL HIGH (ref 78.0–100.0)
RBC: 2.71 MIL/uL — ABNORMAL LOW (ref 4.22–5.81)

## 2012-03-08 LAB — GLUCOSE, CAPILLARY: Glucose-Capillary: 130 mg/dL — ABNORMAL HIGH (ref 70–99)

## 2012-03-08 NOTE — Progress Notes (Signed)
Occupational Therapy Evaluation Patient Details Name: [name] Flynn MRN: [id number] DOB: June 20, 1924 Today's Date: 03/08/2012 Time: 1140-1200 OT Time Calculation (min): 20 min  OT Assessment / Plan / Recommendation Clinical Impression  78 yo s/p fall with resulting R proximal humerus fx. Spoke with Pa on call who gave vo for R shoulder pendulums and ROM R elbow and and wrist. NWB. Pt will benefit from  skilled OT services to max indpendence with ADl and funcitonal mobility for ADlL to faciilitate safe D/C home. pt will need HHOT after d/C.     OT Assessment  Patient needs continued OT Services    Follow Up Recommendations  Home health OT    Barriers to Discharge None    Equipment Recommendations  None recommended by OT    Recommendations for Other Services    Frequency  Min 3X/week    Precautions / Restrictions Precautions Precautions: Shoulder Type of Shoulder Precautions: NWB. Pendulums only Required Braces or Orthoses: Other Brace/Splint (sling) Restrictions Weight Bearing Restrictions: Yes RUE Weight Bearing: Non weight bearing   Pertinent Vitals/Pain 4    ADL  Eating/Feeding: Performed;Modified independent Where Assessed - Eating/Feeding: Edge of bed Grooming: Performed;Set up Where Assessed - Grooming: Unsupported sitting Upper Body Bathing: Simulated;Minimal assistance Where Assessed - Upper Body Bathing: Unsupported sitting Lower Body Bathing: Simulated;Set up Where Assessed - Lower Body Bathing: Unsupported sit to stand Upper Body Dressing: Simulated;Minimal assistance Where Assessed - Upper Body Dressing: Unsupported sitting Lower Body Dressing: Simulated;Set up Where Assessed - Lower Body Dressing: Unsupported sit to stand Toilet Transfer: Simulated;Modified independent Toilet Transfer Method: Sit to Barista: Comfort height toilet Toileting - Clothing Manipulation and Hygiene: Simulated;Modified independent Where Assessed -  Toileting Clothing Manipulation and Hygiene: Standing Transfers/Ambulation Related to ADLs: Mod I ADL Comments: a for UB ADL    OT Diagnosis: Generalized weakness;Acute pain  OT Problem List: Decreased strength;Decreased range of motion;Decreased activity tolerance;Decreased coordination;Decreased knowledge of precautions;Impaired UE functional use;Pain OT Treatment Interventions: Self-care/ADL training;Therapeutic exercise;Energy conservation;Therapeutic activities;Patient/family education   OT Goals Acute Rehab OT Goals OT Goal Formulation: With patient Time For Goal Achievement: 03/15/12 Potential to Achieve Goals: Good ADL Goals Pt Will Perform Grooming: with modified independence;Unsupported ADL Goal: Grooming - Progress: Goal set today Pt Will Perform Upper Body Bathing: with modified independence;Unsupported ADL Goal: Upper Body Bathing - Progress: Goal set today Pt Will Perform Upper Body Dressing: with modified independence;Unsupported ADL Goal: Upper Body Dressing - Progress: Goal set today Additional ADL Goal #1: Pt will be independent with donning/doffing sling ADL Goal: Additional Goal #1 - Progress: Goal set today Arm Goals Pt Will Tolerate PROM: to maintain range of motion;Right upper extremity;1 set;Other (comment) (pendulums. R) Arm Goal: PROM - Progress: Goal set today Additional Arm Goal #1: Pt will be independent with r elbow, wrist hand A/AAROM within pain tolerance. Arm Goal: Additional Goal #1 - Progress: Goal set today  Visit Information  Last OT Received On: 03/08/12    Subjective Data   I take care of my wife   Prior Functioning  Vision/Perception  Home Living Lives With: Spouse Available Help at Discharge: Home health Type of Home: Independent living facility Home Access: Level entry Home Layout: One level Bathroom Shower/Tub: Health visitor: Handicapped height Bathroom Accessibility: Yes How Accessible: Accessible via  wheelchair Home Adaptive Equipment: Bedside commode/3-in-1;Shower chair with back Prior Function Level of Independence: Independent Able to Take Stairs?: Yes Driving: No Vocation: Retired Comments: takes care of his wife who has advanced  Alzheimer's. Has Hospice and Palliative Care Communication Communication: No difficulties Dominant Hand: Right      Cognition  Overall Cognitive Status: Appears within functional limits for tasks assessed/performed Arousal/Alertness: Awake/alert Orientation Level: Appears intact for tasks assessed Behavior During Session: [facility] for tasks performed    Extremity/Trunk Assessment Right Upper Extremity Assessment RUE ROM/Strength/Tone: Deficits;Due to pain;Due to precautions RUE ROM/Strength/Tone Deficits: pendulums only  RUE Sensation: WFL - Light Touch;WFL - Proprioception RUE Coordination: Deficits RUE Coordination Deficits: due to precautions Left Upper Extremity Assessment LUE ROM/Strength/Tone: Within functional levels LUE Sensation: WFL - Light Touch;WFL - Proprioception LUE Coordination: WFL - gross/fine motor Right Lower Extremity Assessment RLE ROM/Strength/Tone: WFL for tasks assessed Left Lower Extremity Assessment LLE ROM/Strength/Tone: WFL for tasks assessed Trunk Assessment Trunk Assessment: Normal   Mobility Bed Mobility Bed Mobility: Supine to Sit Supine to Sit: 6: Modified independent (Device/Increase time) Transfers Transfers: Sit to Stand;Stand to Sit Sit to Stand: 6: Modified independent (Device/Increase time) Stand to Sit: 6: Modified independent (Device/Increase time)   Exercise    Balance Balance Balance Assessed:  (WFL)  End of Session OT - End of Session Activity Tolerance: Patient tolerated treatment well Patient left: in bed;with call bell/phone within reach;with family/visitor present Nurse Communication: Mobility status  GO     Patterson Hollenbaugh,HILLARY 03/08/2012, 12:20 PM [facility], OTR/L  [phone] 03/08/2012

## 2012-03-08 NOTE — Progress Notes (Signed)
Subjective: 1 Day Post-Op Procedure(s) (LRB): OPEN REDUCTION INTERNAL FIXATION (ORIF) PROXIMAL HUMERUS FRACTURE (Right) Patient reports pain as 5 on 0-10 scale.  moderate  Objective:4 Vital signs in last 24 hours: Temp:  [97 F (36.1 C)-98.1 F (36.7 C)] 97.9 F (36.6 C) (07/21 8295) Pulse Rate:  [50-74] 74  (07/21 0621) Resp:  [18-22] 18  (07/21 0621) BP: (118-155)/(51-67) 137/66 mmHg (07/21 0621) SpO2:  [93 %-100 %] 93 % (07/21 0621)  Intake/Output from previous day: 07/20 0701 - 07/21 0700 In: 2460 [P.O.:960; I.V.:1500] Out: 1050 [Urine:800; Blood:250] Intake/Output this shift:     Basename 03/08/12 0625 03/07/12 0630  HGB 9.0* 11.4*    Basename 03/08/12 0625 03/07/12 0630  WBC 10.9* 9.6  RBC 2.71* 3.39*  HCT 27.5* 34.4*  PLT 206 225    Basename 03/08/12 0625 03/07/12 0630  NA 135 137  K 4.1 4.2  CL 101 101  CO2 25 26  BUN 29* 25*  CREATININE 1.53* 1.51*  GLUCOSE 144* 114*  CALCIUM 8.4 9.3   No results found for this basename: LABPT:2,INR:2 in the last 72 hours Dressing dry radial pulse 4+ moves hand and fingers well Intact pulses distally  Assessment/Plan: 1 Day Post-Op Procedure(s) (LRB): OPEN REDUCTION INTERNAL FIXATION (ORIF) PROXIMAL HUMERUS FRACTURE (Right) Plan for discharge tomorrowdoing well monitor today and D/C to [facility] tomorrow  Malene Blaydes,Duward ANDREW 03/08/2012, 10:22 AM

## 2012-03-08 NOTE — Progress Notes (Signed)
Occupational Therapy Treatment Patient Details Name: [name] Flynn MRN: [id number] DOB: February 03, 1924 Today's Date: 03/08/2012 Time: 1478-2956 OT Time Calculation (min): 23 min  OT Assessment / Plan / Recommendation Comments on Treatment Session Very pleasant pt with excellent participation. Completed pendulums with S. AAROM elbow, wrist and hand. properly positioned in bed with ice pack to shoulder. will continue tomorrow. Rec HH services after D/C.    Follow Up Recommendations  Home health OT    Barriers to Discharge  None    Equipment Recommendations  None recommended by OT    Recommendations for Other Services    Frequency Min 3X/week   Plan Discharge plan remains appropriate    Precautions / Restrictions Precautions Precautions: Shoulder Type of Shoulder Precautions: NWB. Pendulums only Precaution Booklet Issued: Yes (comment) Precaution Comments: pendulums and elbow, wrist and hand A/AAROM. can hold light objects Required Braces or Orthoses: Other Brace/Splint Other Brace/Splint: sling Restrictions Weight Bearing Restrictions: Yes RUE Weight Bearing: Non weight bearing   Pertinent Vitals/Pain No c/o    ADL  Eating/Feeding: Performed;Modified independent Where Assessed - Eating/Feeding: Edge of bed Grooming: Performed;Set up Where Assessed - Grooming: Unsupported sitting Upper Body Bathing: Other (comment);Performed;Minimal assistance (began reaching pt how to use pendulum technique) Where Assessed - Upper Body Bathing: Unsupported sitting Lower Body Bathing: Simulated;Set up Where Assessed - Lower Body Bathing: Unsupported sit to stand Upper Body Dressing: Performed;Minimal assistance;Other (comment) (teaching pendulum technique. donned sling with Min A) Where Assessed - Upper Body Dressing: Unsupported sitting Lower Body Dressing: Simulated;Set up Where Assessed - Lower Body Dressing: Unsupported sit to stand Toilet Transfer: Simulated;Modified  independent Toilet Transfer Method: Sit to Barista: Comfort height toilet Toileting - Clothing Manipulation and Hygiene: Simulated;Modified independent Where Assessed - Toileting Clothing Manipulation and Hygiene: Standing Transfers/Ambulation Related to ADLs: Mod I ADL Comments: Began education for compensatory techniques    OT Diagnosis: Generalized weakness;Acute pain  OT Problem List: Decreased strength;Decreased range of motion;Decreased activity tolerance;Decreased coordination;Decreased knowledge of precautions;Impaired UE functional use;Pain OT Treatment Interventions: Self-care/ADL training;Therapeutic exercise;Energy conservation;Therapeutic activities;Patient/family education   OT Goals Acute Rehab OT Goals OT Goal Formulation: With patient Time For Goal Achievement: 03/15/12 Potential to Achieve Goals: Good ADL Goals Pt Will Perform Grooming: with modified independence;Unsupported ADL Goal: Grooming - Progress: Progressing toward goals Pt Will Perform Upper Body Bathing: with modified independence;Unsupported ADL Goal: Upper Body Bathing - Progress: Progressing toward goals Pt Will Perform Upper Body Dressing: with modified independence;Unsupported ADL Goal: Upper Body Dressing - Progress: Progressing toward goals Additional ADL Goal #1: Pt will be independent with donning/doffing sling ADL Goal: Additional Goal #1 - Progress: Progressing toward goals Arm Goals Pt Will Tolerate PROM: to maintain range of motion;Right upper extremity;1 set;Other (comment) Arm Goal: PROM - Progress: Progressing toward goal Additional Arm Goal #1: Pt will be independent with r elbow, wrist hand A/AAROM within pain tolerance. Arm Goal: Additional Goal #1 - Progress: Progressing toward goals  Visit Information  Last OT Received On: 03/08/12    Subjective Data      Prior Functioning  Home Living Lives With: Spouse Available Help at Discharge: Home health Type of  Home: Independent living facility Home Access: Level entry Home Layout: One level Bathroom Shower/Tub: Health visitor: Handicapped height Bathroom Accessibility: Yes How Accessible: Accessible via wheelchair Home Adaptive Equipment: Bedside commode/3-in-1;Shower chair with back Prior Function Level of Independence: Independent Able to Take Stairs?: Yes Driving: No Vocation: Retired Comments: takes care of his wife who has advanced  Alzheimer's. Has Hospice and Palliative Care Communication Communication: No difficulties Dominant Hand: Right    Cognition  Overall Cognitive Status: Appears within functional limits for tasks assessed/performed Arousal/Alertness: Awake/alert Orientation Level: Appears intact for tasks assessed Behavior During Session: [facility] for tasks performed    Mobility Bed Mobility Bed Mobility: Supine to Sit Supine to Sit: 6: Modified independent (Device/Increase time) Transfers Transfers: Sit to Stand;Stand to Sit Sit to Stand: 6: Modified independent (Device/Increase time) Stand to Sit: 6: Modified independent (Device/Increase time)   Exercises Shoulder Exercises Pendulum Exercise: AAROM;Right;10 reps;Standing Elbow Flexion: AAROM;Right;10 reps;Seated Elbow Extension: AAROM;Right;10 reps;Seated Wrist Flexion: AROM;10 reps;Right;Seated Wrist Extension: AROM;Right;10 reps;Seated Digit Composite Flexion: AROM;Right;10 reps Composite Extension: AROM;Right;10 reps Neck Flexion: AROM;10 reps Neck Extension: AROM;10 reps Neck Lateral Flexion - Right: AROM;10 reps Neck Lateral Flexion - Left: AROM;10 reps  Balance Balance Balance Assessed:  (WFL)  End of Session OT - End of Session Equipment Utilized During Treatment: Other (comment) (sling) Activity Tolerance: Patient tolerated treatment well Patient left: in bed;with call bell/phone within reach;with family/visitor present Nurse Communication: Mobility status  GO      Ilia Dimaano,HILLARY 03/08/2012, 2:35 PM [facility], OTR/L  [phone] 03/08/2012

## 2012-03-09 ENCOUNTER — Encounter (HOSPITAL_COMMUNITY): Payer: Self-pay | Admitting: Orthopedic Surgery

## 2012-03-09 LAB — GLUCOSE, CAPILLARY
Glucose-Capillary: 109 mg/dL — ABNORMAL HIGH (ref 70–99)
Glucose-Capillary: 69 mg/dL — ABNORMAL LOW (ref 70–99)

## 2012-03-09 MED ORDER — METHOCARBAMOL 500 MG PO TABS: 500.0000 mg | ORAL_TABLET | Freq: Four times a day (QID) | ORAL | Status: AC | PRN

## 2012-03-09 NOTE — Progress Notes (Signed)
Orthopedics Progress Note  Subjective: Pt sitting comfortably with minimal pain to right shoulder this morning  Objective:  Filed Vitals:   03/09/12 0638  BP: 131/66  Pulse: 68  Temp: 98.2 F (36.8 C)  Resp: 18    General: Awake and alert  Musculoskeletal: right shoulder incision healing well, nv intact distally Neurovascularly intact  Lab Results  Component Value Date   WBC 10.9* 03/08/2012   HGB 9.0* 03/08/2012   HCT 27.5* 03/08/2012   MCV 101.5* 03/08/2012   PLT 206 03/08/2012       Component Value Date/Time   NA 135 03/08/2012 0625   NA 142 12/02/2011 1125   K 4.1 03/08/2012 0625   K 4.5 12/02/2011 1125   CL 101 03/08/2012 0625   CL 102 12/02/2011 1125   CO2 25 03/08/2012 0625   CO2 28 12/02/2011 1125   GLUCOSE 144* 03/08/2012 0625   GLUCOSE 108 12/02/2011 1125   BUN 29* 03/08/2012 0625   BUN 21 12/02/2011 1125   CREATININE 1.53* 03/08/2012 0625   CREATININE 1.6* 12/02/2011 1125   CALCIUM 8.4 03/08/2012 0625   CALCIUM 8.5 12/02/2011 1125   GFRNONAA 40* 03/08/2012 0625   GFRAA 46* 03/08/2012 0625    Lab Results  Component Value Date   INR 1.10 10/24/2010   INR 1.15 09/15/2010    Assessment/Plan: POD #2 s/p Procedure(s):right OPEN REDUCTION INTERNAL FIXATION (ORIF) PROXIMAL HUMERUS FRACTURE  D/c home No lifting or use of right arm F/u in 2 weeks  Almedia Balls. Ranell Patrick, MD 03/09/2012 10:39 AM

## 2012-03-09 NOTE — Progress Notes (Signed)
Occupational Therapy Treatment Patient Details Name: [name] Flynn MRN: [id number] DOB: [birth year] Today's Date: 03/09/2012 Time: 1027-2536 OT Time Calculation (min): 39 min  OT Assessment / Plan / Recommendation Comments on Treatment Session Pt. doing well with ADLs and HEP.  Pt able to perform pendulums with supervision, and able to perform BADLs and sling wear/care with supervision.  Recommend HHOT    Follow Up Recommendations  Home health OT    Barriers to Discharge       Equipment Recommendations  None recommended by OT    Recommendations for Other Services    Frequency Min 3X/week   Plan Discharge plan remains appropriate    Precautions / Restrictions Precautions Precautions: Shoulder Type of Shoulder Precautions: NWB. Pendulums only Precaution Booklet Issued: Yes (comment) Precaution Comments: pendulums and elbow, wrist and hand A/AAROM. can hold light objects Required Braces or Orthoses: Other Brace/Splint Other Brace/Splint: sling Restrictions Weight Bearing Restrictions: Yes RUE Weight Bearing: Non weight bearing   Pertinent Vitals/Pain     ADL  Upper Body Bathing: Simulated;Supervision/safety Where Assessed - Upper Body Bathing: Supported standing Upper Body Dressing: Performed;Supervision/safety (min verbal cues) Where Assessed - Upper Body Dressing: Unsupported sitting;Unsupported standing Equipment Used: Other (comment) (sling) Transfers/Ambulation Related to ADLs: Mod I ADL Comments: Pt able to teach back pendulum exercises, forearm ROM.   Pt. did not recall other instructions.  Pt. instructed in donning/doffing sling, and able to return demo with min verbal cues.  Pt. able to simulate bathing Rt. UE and axilla with supervision, and able to don button front shirt with supervision and min verbal cues.   Pt. instructed in elbow AROM, and wrist ROM.  Pt. able to state precautions.   Pt. reports he has aides 4 days a week to assist with his wife and that his  dtr can assist him intermittently during the day.  Pt. provided with written information re: ADLs, sling wear, positioning, pendulums, and elbow to hand AROM.  Recommend HHOT    OT Diagnosis:    OT Problem List:   OT Treatment Interventions:     OT Goals ADL Goals ADL Goal: Upper Body Bathing - Progress: Progressing toward goals ADL Goal: Upper Body Dressing - Progress: Progressing toward goals ADL Goal: Additional Goal #1 - Progress: Progressing toward goals Arm Goals Arm Goal: PROM - Progress: Progressing toward goal Arm Goal: Additional Goal #1 - Progress: Progressing toward goals  Visit Information  Last OT Received On: 03/09/12    Subjective Data      Prior Functioning       Cognition  Overall Cognitive Status: Appears within functional limits for tasks assessed/performed Arousal/Alertness: Awake/alert Orientation Level: Appears intact for tasks assessed Behavior During Session: [facility] for tasks performed    Mobility Bed Mobility Bed Mobility: Supine to Sit Supine to Sit: HOB flat Transfers Transfers: Sit to Stand;Stand to Sit Sit to Stand: 6: Modified independent (Device/Increase time) Stand to Sit: 6: Modified independent (Device/Increase time)   Exercises Shoulder Exercises Pendulum Exercise: AAROM;Right;10 reps;Standing Elbow Flexion: AROM;Right;10 reps;Standing Elbow Extension: AROM;10 reps;Right;Standing Wrist Flexion: AROM;10 reps;Right;Seated Wrist Extension: AROM;Right;10 reps;Seated Digit Composite Flexion: AROM;Right;10 reps Composite Extension: AROM;Right;10 reps  Balance    End of Session OT - End of Session Equipment Utilized During Treatment:  (sling) Activity Tolerance: Patient tolerated treatment well Patient left: in bed;with call bell/phone within reach  GO     [name] Flynn M 03/09/2012, 1:02 PM

## 2012-03-09 NOTE — Progress Notes (Signed)
Last night around 2015, [name] Flynn called me to his room, he was having a nose bleed through his right nare.  I placed a cold wash cloth on his nose, applied pressure and told him to lay his head back.  Within 15 minutes the bleeding stopped.  He remained a/o and has had no further incidents or complaints.

## 2012-03-09 NOTE — Discharge Summary (Signed)
Physician Discharge Summary  Patient ID: [name] Flynn MRN: [id number] DOB/AGE: 1924-02-27 78 y.o.  Admit date: 03/07/2012 Discharge date: 03/09/2012  Admission Diagnoses:  Right proximal humerus fracture  Discharge Diagnoses:  Same   Surgeries: Procedure(s): OPEN REDUCTION INTERNAL FIXATION (ORIF) PROXIMAL HUMERUS FRACTURE on 03/07/2012   Consultants: PT/OT  Discharged Condition: Stable  Hospital Course: [name] Flynn is an 78 y.o. male who was admitted 03/07/2012 with a chief complaint of No chief complaint on file. , and found to have a diagnosis of <principal problem not specified>.  They were brought to the operating room on 03/07/2012 and underwent the above named procedures.    The patient had an uncomplicated hospital course and was stable for discharge.  Recent vital signs:  Filed Vitals:   03/09/12 0638  BP: 131/66  Pulse: 68  Temp: 98.2 F (36.8 C)  Resp: 18    Recent laboratory studies:  Results for orders placed during the hospital encounter of 03/07/12  SURGICAL PCR SCREEN      Component Value Range   MRSA, PCR NEGATIVE  NEGATIVE   Staphylococcus aureus NEGATIVE  NEGATIVE  CBC      Component Value Range   WBC 9.6  4.0 - 10.5 K/uL   RBC 3.39 (*) 4.22 - 5.81 MIL/uL   Hemoglobin 11.4 (*) 13.0 - 17.0 g/dL   HCT 19.1 (*) 47.8 - 29.5 %   MCV 101.5 (*) 78.0 - 100.0 fL   MCH 33.6  26.0 - 34.0 pg   MCHC 33.1  30.0 - 36.0 g/dL   RDW 62.1  30.8 - 65.7 %   Platelets 225  150 - 400 K/uL  BASIC METABOLIC PANEL      Component Value Range   Sodium 137  135 - 145 mEq/L   Potassium 4.2  3.5 - 5.1 mEq/L   Chloride 101  96 - 112 mEq/L   CO2 26  19 - 32 mEq/L   Glucose, Bld 114 (*) 70 - 99 mg/dL   BUN 25 (*) 6 - 23 mg/dL   Creatinine, Ser 8.46 (*) 0.50 - 1.35 mg/dL   Calcium 9.3  8.4 - 96.2 mg/dL   GFR calc non Af Amer 40 (*) >90 mL/min   GFR calc Af Amer 47 (*) >90 mL/min  CBC      Component Value Range   WBC 10.9 (*) 4.0 - 10.5 K/uL   RBC 2.71 (*)  4.22 - 5.81 MIL/uL   Hemoglobin 9.0 (*) 13.0 - 17.0 g/dL   HCT 95.2 (*) 84.1 - 32.4 %   MCV 101.5 (*) 78.0 - 100.0 fL   MCH 33.2  26.0 - 34.0 pg   MCHC 32.7  30.0 - 36.0 g/dL   RDW 40.1  02.7 - 25.3 %   Platelets 206  150 - 400 K/uL  BASIC METABOLIC PANEL      Component Value Range   Sodium 135  135 - 145 mEq/L   Potassium 4.1  3.5 - 5.1 mEq/L   Chloride 101  96 - 112 mEq/L   CO2 25  19 - 32 mEq/L   Glucose, Bld 144 (*) 70 - 99 mg/dL   BUN 29 (*) 6 - 23 mg/dL   Creatinine, Ser 6.64 (*) 0.50 - 1.35 mg/dL   Calcium 8.4  8.4 - 40.3 mg/dL   GFR calc non Af Amer 40 (*) >90 mL/min   GFR calc Af Amer 46 (*) >90 mL/min  GLUCOSE, CAPILLARY      Component  Value Range   Glucose-Capillary 182 (*) 70 - 99 mg/dL  GLUCOSE, CAPILLARY      Component Value Range   Glucose-Capillary 130 (*) 70 - 99 mg/dL  GLUCOSE, CAPILLARY      Component Value Range   Glucose-Capillary 77  70 - 99 mg/dL  GLUCOSE, CAPILLARY      Component Value Range   Glucose-Capillary 82  70 - 99 mg/dL    Discharge Medications:   Medication List  As of 03/09/2012 10:42 AM   TAKE these medications         allopurinol 300 MG tablet   Commonly known as: ZYLOPRIM   Take 300 mg by mouth daily.      aspirin 325 MG tablet   Take 81 mg by mouth daily.      atenolol 50 MG tablet   Commonly known as: TENORMIN   Take 50 mg by mouth daily.      colchicine 0.6 MG tablet   Take 0.6 mg by mouth daily.      glimepiride 2 MG tablet   Commonly known as: AMARYL   Take 2 mg by mouth daily before breakfast.      HYDROcodone-acetaminophen 5-500 MG per tablet   Commonly known as: VICODIN   Take 1-2 tablets by mouth every 6 (six) hours as needed for pain.      methocarbamol 500 MG tablet   Commonly known as: ROBAXIN   Take 1 tablet (500 mg total) by mouth every 6 (six) hours as needed.      multivitamin tablet   Take 1 tablet by mouth daily.      rosuvastatin 10 MG tablet   Commonly known as: CRESTOR   Take 10 mg by mouth  daily.            Diagnostic Studies: Dg Chest 2 View  03/07/2012  *RADIOLOGY REPORT*  Clinical Data: Preop for numerous fixation.  Hypertension and diabetes.  Nonsmoker.  CHEST - 2 VIEW  Comparison: 10/02/2010  Findings: Borderline heart size with normal pulmonary vascularity. Calcified and tortuous aorta.  Emphysematous changes and scattered fibrosis in the lungs.  Calcified granuloma in the right midlung. Old left rib fractures.  No focal airspace consolidation.  No blunting of costophrenic angles.  No pneumothorax.  Fracture of the proximal right humerus is demonstrated on the edge of the film. Surgical clips in the right upper quadrant.  Degenerative changes in the spine.  IMPRESSION: Emphysema and scattered fibrosis in the lungs.  No evidence of active pulmonary disease.  Right proximal humeral fracture.  Original Report Authenticated By: Marlon Pel, M.D.   Dg Shoulder Right  02/28/2012  *RADIOLOGY REPORT*  Clinical Data: Larey Seat today with right shoulder pain  RIGHT SHOULDER - 2+ VIEW  Comparison: None.  Findings: There is a transverse angulated fracture of the right humeral neck - proximal humerus.  Degenerative change is present at the right [facility] joint.  The ribs that are visualized appear intact.  IMPRESSION: Transverse angulated fracture of the right humeral neck - proximal humerus.  Original Report Authenticated By: Juline Patch, M.D.   Dg Shoulder Right Port  03/07/2012  *RADIOLOGY REPORT*  Clinical Data: Status post ORIF of proximal humerus fracture.  PORTABLE RIGHT SHOULDER - 2+ VIEW  Comparison: 02/28/2012.  Findings: Compared to the prior examination and there has been interval ORIF for proximal humeral diaphysis fracture with a lateral plate and screw fixation device in place now.  Surgical staples are seen overlying the joint.  The humeral head projects in close association with the glenoid.  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  Several calcified granulomas are noted in the right lung. Atherosclerosis of the aortic arch.  IMPRESSION: Expected postoperative changes of ORIF in the right proximal humerus with restoration of anatomic alignment, as above.  Original Report Authenticated By: Florencia Reasons, M.D.    Disposition: 01-Home or Self Care  Discharge Orders    Future Appointments: Provider: Department: Dept Phone: Center:   06/05/2012 9:00 AM Krista Blue [facility] [phone] None   06/05/2012 9:30 AM Benjiman Core, MD [facility] [phone] None     Future Orders Please Complete By Expires   Diet - low sodium heart healthy      Call MD / Call 911      Comments:   If you experience chest pain or shortness of breath, CALL 911 and be transported to the hospital emergency room.  If you develope a fever above 101 F, pus (white drainage) or increased drainage or redness at the wound, or calf pain, call your surgeon's office.   Constipation Prevention      Comments:   Drink plenty of fluids.  Prune juice may be helpful.  You may use a stool softener, such as Colace (over the counter) 100 mg twice a day.  Use MiraLax (over the counter) for constipation as needed.   Increase activity slowly as tolerated         Follow-up Information    Follow up with NORRIS,STEVEN R, MD. Call in 2 weeks. [phone])    Contact information:   [facility] [address], [address] [place] [address] [phone]           Signed: Thea Gist 03/09/2012, 10:42 AM

## 2012-03-09 NOTE — Progress Notes (Signed)
CBG: 69  Treatment: 15 GM carbohydrate snack  Symptoms: None  Follow-up CBG: Time:1125 CBG Result:136  Possible Reasons for Event: Unknown  Comments/MD notified:N/A    [name] Flynn

## 2012-03-09 NOTE — Care Management Note (Signed)
    Page 1 of 1   03/09/2012     12:46:11 PM   CARE MANAGEMENT NOTE 03/09/2012  Patient:  [name] Flynn, [name] Flynn   Account Number:  [id number]  Date Initiated:  03/09/2012  Documentation initiated by:  Anette Guarneri  Subjective/Objective Assessment:   POD#2 s/p right humerus fracture ORIF     Action/Plan:   home with [facility] services   Anticipated DC Date:  03/09/2012   Anticipated DC Plan:  HOME W HOME HEALTH SERVICES      DC Planning Services  CM consult      [facility] Choice  HOME HEALTH   Choice offered to / List presented to:  C-1 Patient        HH arranged  HH-2 PT  HH-3 OT      [facility] agency  [facility]   Status of service:  Completed, signed off Medicare Important Message given?   (If response is "NO", the following Medicare IM given date fields will be blank) Date Medicare IM given:   Date Additional Medicare IM given:    Discharge Disposition:  HOME W HOME HEALTH SERVICES  Per UR Regulation:  Reviewed for med. necessity/level of care/duration of stay  If discussed at Long Length of Stay Meetings, dates discussed:    Comments:  03/09/12  12:43  Anette Guarneri RN/CM from ALF, has 1 meal/day provided, wife with Alzheimers, will needs HHPT/OT patient choice for [facility] services is Genevieve Norlander, notified [facility], services will be provided at discharge.

## 2012-03-09 NOTE — Progress Notes (Signed)
UR COMPLETED  

## 2012-03-10 LAB — GLUCOSE, CAPILLARY: Glucose-Capillary: 136 mg/dL — ABNORMAL HIGH (ref 70–99)

## 2012-05-30 ENCOUNTER — Emergency Department (HOSPITAL_COMMUNITY): Payer: Medicare Other

## 2012-05-30 ENCOUNTER — Encounter (HOSPITAL_COMMUNITY): Payer: Self-pay | Admitting: Emergency Medicine

## 2012-05-30 ENCOUNTER — Emergency Department (HOSPITAL_COMMUNITY)
Admission: EM | Admit: 2012-05-30 | Discharge: 2012-05-30 | Disposition: A | Discharge status: Home / Self Care | Payer: Medicare Other | Attending: Emergency Medicine | Admitting: Emergency Medicine

## 2012-05-30 DIAGNOSIS — Y9289 Other specified places as the place of occurrence of the external cause: Secondary | ICD-10-CM | POA: Insufficient documentation

## 2012-05-30 DIAGNOSIS — Z9089 Acquired absence of other organs: Secondary | ICD-10-CM | POA: Insufficient documentation

## 2012-05-30 DIAGNOSIS — S022XXA Fracture of nasal bones, initial encounter for closed fracture: Secondary | ICD-10-CM | POA: Insufficient documentation

## 2012-05-30 DIAGNOSIS — W010XXA Fall on same level from slipping, tripping and stumbling without subsequent striking against object, initial encounter: Secondary | ICD-10-CM | POA: Insufficient documentation

## 2012-05-30 DIAGNOSIS — Z7982 Long term (current) use of aspirin: Secondary | ICD-10-CM | POA: Insufficient documentation

## 2012-05-30 DIAGNOSIS — I2510 Atherosclerotic heart disease of native coronary artery without angina pectoris: Secondary | ICD-10-CM | POA: Insufficient documentation

## 2012-05-30 DIAGNOSIS — S02400A Malar fracture unspecified, initial encounter for closed fracture: Secondary | ICD-10-CM | POA: Insufficient documentation

## 2012-05-30 DIAGNOSIS — Z79899 Other long term (current) drug therapy: Secondary | ICD-10-CM | POA: Insufficient documentation

## 2012-05-30 DIAGNOSIS — R040 Epistaxis: Secondary | ICD-10-CM | POA: Insufficient documentation

## 2012-05-30 DIAGNOSIS — E119 Type 2 diabetes mellitus without complications: Secondary | ICD-10-CM | POA: Insufficient documentation

## 2012-05-30 DIAGNOSIS — I10 Essential (primary) hypertension: Secondary | ICD-10-CM | POA: Insufficient documentation

## 2012-05-30 DIAGNOSIS — S02401A Maxillary fracture, unspecified, initial encounter for closed fracture: Secondary | ICD-10-CM | POA: Insufficient documentation

## 2012-05-30 DIAGNOSIS — IMO0002 Reserved for concepts with insufficient information to code with codable children: Secondary | ICD-10-CM

## 2012-05-30 LAB — CBC
Hemoglobin: 9.5 g/dL — ABNORMAL LOW (ref 13.0–17.0)
MCV: 101 fL — ABNORMAL HIGH (ref 78.0–100.0)
Platelets: 268 10*3/uL (ref 150–400)
RBC: 2.86 MIL/uL — ABNORMAL LOW (ref 4.22–5.81)
WBC: 15.7 10*3/uL — ABNORMAL HIGH (ref 4.0–10.5)

## 2012-05-30 MED ORDER — OXYMETAZOLINE HCL 0.05 % NA SOLN
NASAL | Status: AC
  Administered 2012-05-30: 17:00:00
  Filled 2012-05-30: qty 15

## 2012-05-30 MED ORDER — HYDROCODONE-ACETAMINOPHEN 5-500 MG PO TABS: 1.0000 | ORAL_TABLET | Freq: Four times a day (QID) | ORAL | Status: DC | PRN

## 2012-05-30 MED ORDER — ASPIRIN 325 MG PO TABS: 325.0000 mg | ORAL_TABLET | ORAL | Status: DC

## 2012-05-30 MED ORDER — COCAINE HCL 4 % EX SOLN
4.0000 mL | Freq: Once | CUTANEOUS | Status: AC
  Administered 2012-05-30: 4 mL via NASAL
  Filled 2012-05-30: qty 4

## 2012-05-30 NOTE — ED Provider Notes (Addendum)
History     CSN: [id number] Arrival date & time 05/30/12  1335 MD Initiated Contact with Patient 05/30/12 1346    Chief Complaint  Patient presents with  . Fall  . Head Laceration   HPI Patient was walking outside today when he tripped on a concrete barrier in a parking lot. The patient fell landing on his face.  Patient sustained a laceration to his face as well as bruising and swelling to his nose. He does have epistaxis now following the fall. He denies any use of blood thinners other than an aspirin daily . He denies any loss of consciousness. He denies any weakness, nausea, numbness, vomiting or diarrhea. He denies any chest pain or shortness of breath.  Past Medical History  Diagnosis Date  . Diabetes mellitus   . Hypertension   . Pancreatitis   . CAD (coronary artery disease)   . Hyperlipemia   . Anemia     macrocytic    Past Surgical History  Procedure Date  . Appendectomy   . Cholecystectomy   . Angioplasty   . Ureteral stent placement     Left  . Orif humerus fracture 03/07/2012    Procedure: OPEN REDUCTION INTERNAL FIXATION (ORIF) PROXIMAL HUMERUS FRACTURE;  Surgeon: Verlee Rossetti, MD;  Location: [facility] OR;  Service: Orthopedics;  Laterality: Right;    No family history on file.  History  Substance Use Topics  . Smoking status: Never Smoker   . Smokeless tobacco: Not on file  . Alcohol Use: No      Review of Systems  All other systems reviewed and are negative.    Allergies  Review of patient's allergies indicates no known allergies.  Home Medications   Current Outpatient Rx  Name Route Sig Dispense Refill  . ALLOPURINOL 300 MG PO TABS Oral Take 300 mg by mouth daily.      . ASPIRIN 325 MG PO TABS Oral Take 325 mg by mouth every other day.     . ATENOLOL 50 MG PO TABS Oral Take 50 mg by mouth daily.      . COLCHICINE 0.6 MG PO TABS Oral Take 0.6 mg by mouth daily.      Marland Kitchen GLIMEPIRIDE 2 MG PO TABS Oral Take 2 mg by mouth daily before breakfast.       . ONE-DAILY MULTI VITAMINS PO TABS Oral Take 1 tablet by mouth daily.    Marland Kitchen ROSUVASTATIN CALCIUM 10 MG PO TABS Oral Take 10 mg by mouth daily.        BP 164/66  Pulse 69  Temp 98 F (36.7 C)  Resp 20  SpO2 97%  Physical Exam  Nursing note and vitals reviewed. Constitutional: He appears well-developed and well-nourished. No distress.  HENT:  Head: Normocephalic. Head is with contusion and with laceration. Head is without raccoon's eyes and without Battle's sign.  Right Ear: External ear normal.  Left Ear: External ear normal.  Nose: Sinus tenderness and nasal deformity present. No nasal septal hematoma. Epistaxis is observed.       Laceration above left eyebrow, no periorbital bony tenderness to palpation  Eyes: Conjunctivae normal are normal. Right eye exhibits no discharge. Left eye exhibits no discharge. No scleral icterus.  Neck: Neck supple. No tracheal deviation present.  Cardiovascular: Normal rate, regular rhythm and intact distal pulses.   Pulmonary/Chest: Effort normal and breath sounds normal. No stridor. No respiratory distress. He has no wheezes. He has no rales.  Abdominal: Soft. Bowel sounds are  normal. He exhibits no distension. There is no tenderness. There is no rebound and no guarding.  Musculoskeletal: He exhibits no edema and no tenderness.  Neurological: He is alert. He has normal strength. No sensory deficit. Cranial nerve deficit:  no gross defecits noted. He exhibits normal muscle tone. He displays no seizure activity. Coordination normal.  Skin: Skin is warm and dry. No rash noted.  Psychiatric: He has a normal mood and affect.    ED Course  EPISTAXIS MANAGEMENT Performed by: Linwood Dibbles R Authorized by: Linwood Dibbles R Consent: Verbal consent obtained. Patient sedated: no Treatment site: left anterior Repair method: merocel sponge Post-procedure assessment: bleeding decreased Treatment complexity: complex Recurrence: recurrence of recent bleed Patient  tolerance: Patient tolerated the procedure well with no immediate complications. Comments: Applied afrin aerosol.  Reapplied merocel sponge.  Will monitor   (including critical care time)  Labs Reviewed - No data to display Ct Head Wo Contrast  05/30/2012  *RADIOLOGY REPORT*  Clinical Data:  Fall.  Facial injury.  Pain.  CT HEAD WITHOUT CONTRAST CT MAXILLOFACIAL WITHOUT CONTRAST  Technique:  Multidetector CT imaging of the head and maxillofacial structures were performed using the standard protocol without intravenous contrast. Multiplanar CT image reconstructions of the maxillofacial structures were also generated.  Comparison:   None.  CT HEAD  Findings: Mild generalized atrophy is present.  Scattered periventricular and subcortical white matter hypoattenuation is evident bilaterally.  No acute cortical infarct, hemorrhage, or mass lesion is present.  The ventricles are proportionate to the degree of atrophy.  No significant extra-axial fluid collection is present.  A prominent left supraorbital scalp hematoma extends to the glabella.  Multiple nasal bone fractures are described below. There is blood in the maxillary sinuses bilaterally.  Fluid is present in the anterior left ethmoid air cells without an additional fracture.  The osseous skull is otherwise intact.  IMPRESSION:  1.  Prominent left supraorbital scalp hematoma extending to the glabella. 2.  Multiple nasal bone fractures are slightly displaced to the right. 3.  Blood within the maxillary sinuses bilaterally suggesting maxillary sinus fractures. 4.  Atrophy and diffuse white matter disease.  This likely reflects the sequelae of chronic microvascular ischemia. 5.  No acute intracranial abnormality.  CT MAXILLOFACIAL  Findings:   A prominent left supraorbital scalp hematoma extends to the glabella.  Comminuted nasal bone fractures are slightly displaced to the right.  There is a fracture through the anterior osseous nasal septum.  Blood is  present within the nasal cavity. There is blood in the maxillary sinuses bilaterally, left greater than right.  No definite maxillary sinus fracture is identified. The zygomatic arches are intact bilaterally.  The soft tissues are otherwise unremarkable.  The mandible is intact and located.  The upper cervical spine demonstrates degenerative change without acute abnormality.  IMPRESSION:  1.  Comminuted nasal bone fractures are deviated to the right. 2.  Anterior nasal septal fracture. 3.  Moderate blood within the nasal cavity. 4.  Blood in the maxillary sinuses bilaterally, greater on the left.  This suggests an occult fracture although some blood may be from the nasal cavity.  No focal maxillary fracture is identified.   Original Report Authenticated By: Jamesetta Orleans. MATTERN, M.D.    Ct Maxillofacial Wo Cm  05/30/2012  *RADIOLOGY REPORT*  Clinical Data:  Fall.  Facial injury.  Pain.  CT HEAD WITHOUT CONTRAST CT MAXILLOFACIAL WITHOUT CONTRAST  Technique:  Multidetector CT imaging of the head and maxillofacial structures were performed  using the standard protocol without intravenous contrast. Multiplanar CT image reconstructions of the maxillofacial structures were also generated.  Comparison:   None.  CT HEAD  Findings: Mild generalized atrophy is present.  Scattered periventricular and subcortical white matter hypoattenuation is evident bilaterally.  No acute cortical infarct, hemorrhage, or mass lesion is present.  The ventricles are proportionate to the degree of atrophy.  No significant extra-axial fluid collection is present.  A prominent left supraorbital scalp hematoma extends to the glabella.  Multiple nasal bone fractures are described below. There is blood in the maxillary sinuses bilaterally.  Fluid is present in the anterior left ethmoid air cells without an additional fracture.  The osseous skull is otherwise intact.  IMPRESSION:  1.  Prominent left supraorbital scalp hematoma extending to the  glabella. 2.  Multiple nasal bone fractures are slightly displaced to the right. 3.  Blood within the maxillary sinuses bilaterally suggesting maxillary sinus fractures. 4.  Atrophy and diffuse white matter disease.  This likely reflects the sequelae of chronic microvascular ischemia. 5.  No acute intracranial abnormality.  CT MAXILLOFACIAL  Findings:   A prominent left supraorbital scalp hematoma extends to the glabella.  Comminuted nasal bone fractures are slightly displaced to the right.  There is a fracture through the anterior osseous nasal septum.  Blood is present within the nasal cavity. There is blood in the maxillary sinuses bilaterally, left greater than right.  No definite maxillary sinus fracture is identified. The zygomatic arches are intact bilaterally.  The soft tissues are otherwise unremarkable.  The mandible is intact and located.  The upper cervical spine demonstrates degenerative change without acute abnormality.  IMPRESSION:  1.  Comminuted nasal bone fractures are deviated to the right. 2.  Anterior nasal septal fracture. 3.  Moderate blood within the nasal cavity. 4.  Blood in the maxillary sinuses bilaterally, greater on the left.  This suggests an occult fracture although some blood may be from the nasal cavity.  No focal maxillary fracture is identified.   Original Report Authenticated By: Jamesetta Orleans. MATTERN, M.D.      1. Nasal fracture   2. Epistaxis   3. Maxillary fracture   4. Laceration       MDM  Pt is having persitent nose bleed associated with his nasal fracture.  Rapid rhino packing placed in left nares without significant improvement.  Will consult ENT and check labs.  Laceration being repaired by PA Paz.  Conducted as a shared visit with non-physician practitioner(s) and myself.  I personally evaluated the patient during the encounter   D/w Dr Cloria Spring.  Will see patient in the ED if this doesn't stop after 30 min post afrin.  Celene Kras, MD 05/30/12  [phone]

## 2012-05-30 NOTE — ED Notes (Signed)
MD at bedside. 

## 2012-05-30 NOTE — ED Provider Notes (Signed)
LACERATION REPAIR Date/Time: 05/30/2012 4:54 PM Performed by: Jaci Carrel Authorized by: Jaci Carrel Consent: Verbal consent obtained. Written consent not obtained. Risks and benefits: risks, benefits and alternatives were discussed Consent given by: patient Patient understanding: patient states understanding of the procedure being performed Patient consent: the patient's understanding of the procedure matches consent given Patient identity confirmed: verbally with patient and arm band Body area: head/neck Location details: left eyebrow Laceration length: 2 cm Foreign body present: blunt force. Tendon involvement: none Nerve involvement: none Anesthesia: local infiltration Local anesthetic: lidocaine 2% without epinephrine and lidocaine 2% with epinephrine Irrigation solution: saline Irrigation method: syringe Amount of cleaning: extensive Debridement: minimal Degree of undermining: minimal Skin closure: 5-0 nylon Number of sutures: 6 Technique: simple Approximation: close Approximation difficulty: complex Dressing: antibiotic ointment Patient tolerance: Patient tolerated the procedure well with no immediate complications.  Pt seen with Dr. Lynelle Doctor. I have resumed care and will re-page ENT if Epistaxis is not resolved with afrin, ICE, compression and rhino rocket.   5:07 PM CBC resulted with hemoglobin of 9.5, per previous labs patient had a history of a hemoglobin of 9 that was drawn 2 months ago. Anemia appears to be stable. On re-evaluation pt continues to have significant epistaxis left >right. ENT has been re-paged.   6:50 PM  Dr. Lazarus Salines has packed nares. Epistaxis resolved. Requests pt be dc and follow up in his office next week.  At this time there does not appear to be any evidence of an acute emergency medical condition and the patient appears stable for discharge with appropriate outpatient follow up.Diagnosis was discussed with patient who verbalizes understanding and  is agreeable to discharge.  Jaci Carrel, [place] 05/30/12 [id number]

## 2012-05-30 NOTE — ED Notes (Signed)
Pt fell outside [employer] after tripping on concrete barrier in parking lot. Pt reports that he fell "right on his face." Pt denies dizziness and stated that he just tripped. Pt has approx 1 1/2 in lac above L eye and on bridge of nose. Pt reports no blood thinners. Bleeding is controlled with minimal oozing. Pt friend at bedside. Pt in NAD and A&Ox4.

## 2012-05-30 NOTE — Consult Note (Signed)
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 No loss of consciousness. No vision complaints. He sustained a laceration of the left brow/ forehead.   he maxillofacial CT scan shows comminuted nasal fracture and possible nasal septal fracture.  frothy fluid in the nose and pharynx. Fluid in multiple sinuses.   The foreign laceration was closed by the emergency room physicians. Over several hours continued observation, he has had persistent bloody weeping from the left nose predominantly. He takes one aspirin daily. Blood pressure is okay. He does have history of diabetes and hypertension. He has had occasional right-sided epistaxis related to minor allergies and trauma. Emergency room doctors tried Afrin and tried packing both unsuccessful in stopping the bleeding.   PMH: Past Medical History  Diagnosis Date  . Diabetes mellitus   . Hypertension   . Pancreatitis   . CAD (coronary artery disease)   . Hyperlipemia   . Anemia     macrocytic    Surg Hx: Past Surgical History  Procedure Date  . Appendectomy   . Cholecystectomy   . Angioplasty   . Ureteral stent placement     Left  . Orif humerus fracture 03/07/2012    Procedure: OPEN REDUCTION INTERNAL FIXATION (ORIF) PROXIMAL HUMERUS FRACTURE;  Surgeon: Verlee Rossetti, MD;  Location: [facility] OR;  Service: Orthopedics;  Laterality: Right;    FHx:  No family history on file. SocHx:  reports that he has never smoked. He does not have any smokeless tobacco history on file. He reports that he does not drink alcohol or use illicit drugs.  ALLERGIES: No Known Allergies   (Not in a hospital admission)  No results found for this or any previous visit (from the past 48 hour(s)). Ct Head Wo Contrast  05/30/2012  *RADIOLOGY REPORT*  Clinical Data:  Fall.  Facial  injury.  Pain.  CT HEAD WITHOUT CONTRAST CT MAXILLOFACIAL WITHOUT CONTRAST  Technique:  Multidetector CT imaging of the head and maxillofacial structures were performed using the standard protocol without intravenous contrast. Multiplanar CT image reconstructions of the maxillofacial structures were also generated.  Comparison:   None.  CT HEAD  Findings: Mild generalized atrophy is present.  Scattered periventricular and subcortical white matter hypoattenuation is evident bilaterally.  No acute cortical infarct, hemorrhage, or mass lesion is present.  The ventricles are proportionate to the degree of atrophy.  No significant extra-axial fluid collection is present.  A prominent left supraorbital scalp hematoma extends to the glabella.  Multiple nasal bone fractures are described below. There is blood in the maxillary sinuses bilaterally.  Fluid is present in the anterior left ethmoid air cells without an additional fracture.  The osseous skull is otherwise intact.  IMPRESSION:  1.  Prominent left supraorbital scalp hematoma extending to the glabella. 2.  Multiple nasal bone fractures are slightly displaced to the right. 3.  Blood within the maxillary sinuses bilaterally suggesting maxillary sinus fractures. 4.  Atrophy and diffuse white matter disease.  This likely reflects the sequelae of chronic microvascular ischemia. 5.  No acute intracranial abnormality.  CT MAXILLOFACIAL  Findings:   A prominent left supraorbital scalp hematoma extends to the glabella.  Comminuted nasal bone fractures are slightly displaced to the right.  There is a fracture through the anterior osseous nasal septum.  Blood is present within the  nasal cavity. There is blood in the maxillary sinuses bilaterally, left greater than right.  No definite maxillary sinus fracture is identified. The zygomatic arches are intact bilaterally.  The soft tissues are otherwise unremarkable.  The mandible is intact and located.  The upper cervical spine  demonstrates degenerative change without acute abnormality.  IMPRESSION:  1.  Comminuted nasal bone fractures are deviated to the right. 2.  Anterior nasal septal fracture. 3.  Moderate blood within the nasal cavity. 4.  Blood in the maxillary sinuses bilaterally, greater on the left.  This suggests an occult fracture although some blood may be from the nasal cavity.  No focal maxillary fracture is identified.   Original Report Authenticated By: Jamesetta Orleans. MATTERN, M.D.    Ct Maxillofacial Wo Cm  05/30/2012  *RADIOLOGY REPORT*  Clinical Data:  Fall.  Facial injury.  Pain.  CT HEAD WITHOUT CONTRAST CT MAXILLOFACIAL WITHOUT CONTRAST  Technique:  Multidetector CT imaging of the head and maxillofacial structures were performed using the standard protocol without intravenous contrast. Multiplanar CT image reconstructions of the maxillofacial structures were also generated.  Comparison:   None.  CT HEAD  Findings: Mild generalized atrophy is present.  Scattered periventricular and subcortical white matter hypoattenuation is evident bilaterally.  No acute cortical infarct, hemorrhage, or mass lesion is present.  The ventricles are proportionate to the degree of atrophy.  No significant extra-axial fluid collection is present.  A prominent left supraorbital scalp hematoma extends to the glabella.  Multiple nasal bone fractures are described below. There is blood in the maxillary sinuses bilaterally.  Fluid is present in the anterior left ethmoid air cells without an additional fracture.  The osseous skull is otherwise intact.  IMPRESSION:  1.  Prominent left supraorbital scalp hematoma extending to the glabella. 2.  Multiple nasal bone fractures are slightly displaced to the right. 3.  Blood within the maxillary sinuses bilaterally suggesting maxillary sinus fractures. 4.  Atrophy and diffuse white matter disease.  This likely reflects the sequelae of chronic microvascular ischemia. 5.  No acute intracranial  abnormality.  CT MAXILLOFACIAL  Findings:   A prominent left supraorbital scalp hematoma extends to the glabella.  Comminuted nasal bone fractures are slightly displaced to the right.  There is a fracture through the anterior osseous nasal septum.  Blood is present within the nasal cavity. There is blood in the maxillary sinuses bilaterally, left greater than right.  No definite maxillary sinus fracture is identified. The zygomatic arches are intact bilaterally.  The soft tissues are otherwise unremarkable.  The mandible is intact and located.  The upper cervical spine demonstrates degenerative change without acute abnormality.  IMPRESSION:  1.  Comminuted nasal bone fractures are deviated to the right. 2.  Anterior nasal septal fracture. 3.  Moderate blood within the nasal cavity. 4.  Blood in the maxillary sinuses bilaterally, greater on the left.  This suggests an occult fracture although some blood may be from the nasal cavity.  No focal maxillary fracture is identified.   Original Report Authenticated By: Jamesetta Orleans. MATTERN, M.D.     Blood pressure 164/66, pulse 69, temperature 98 F (36.7 C), resp. rate 20, SpO2 97.00%.  PHYSICAL EXAM:  he is mentally alert and appropriate. He has substantial ecchymotic discoloration of the midface orbits and nose. He has a closed laceration of the medial left brow and forehead. He has old blood, clots, and bleeding in both sides of the nose, left worse than right. Old blood in the pharynx  but no active posterior bleeding. He has a slight right hemotympanum probably from blowing. The oral cavity reveals teeth in good repair. Oropharynx clear with old blood. Neck unremarkable.  After blowing clots out of his nose, he has a laceration along the left inferior anterior quadrangular cartilage and nasal spine region which is bleeding, and additional bleeding from the high vault of the left nose. No significant bleeding from the right side.   Studies  Reviewed:Maxillofacial CT    Assessment/Plan Closed comminuted nasal fracture with epistaxis from the high vault of the nose and from a septal mucosal laceration low anterior.  With informed consent, I anesthetized the interior nose with 4% cocaine solution. After allowing several minutes for this to take effect, I cauterized the low septal laceration on the left with silver nitrate. I packed the high nasal vault with fibrillar Surgicel. This was well tolerated by the patient. Hemostasis was achieved.  I will have him hold off on his aspirin for 3 or 4 days. Avoid strenuous activities for 10 days. Nasal hygiene measures, ice, elevation. We will reassess his nasal fracture in 5 days in my office to see if he requires any repair. Vicodin for pain. No antibiotics needed.   Flo Shanks 05/30/2012, 6:31 PM

## 2012-05-30 NOTE — ED Notes (Signed)
ENT MD at bedside.

## 2012-05-30 NOTE — ED Notes (Signed)
Nose oozing blood-applied pressure-inform PA of continued oozing

## 2012-05-31 NOTE — ED Provider Notes (Signed)
Medical screening examination/treatment/procedure(s) were conducted as a shared visit with non-physician practitioner(s) and myself.  I personally evaluated the patient during the encounter   [name] Kras, MD 05/31/12 [phone]

## 2012-06-01 LAB — POCT I-STAT, CHEM 8
BUN: 39 mg/dL — ABNORMAL HIGH (ref 6–23)
Calcium, Ion: 1.11 mmol/L — ABNORMAL LOW (ref 1.13–1.30)
Chloride: 107 mEq/L (ref 96–112)
Creatinine, Ser: 1.5 mg/dL — ABNORMAL HIGH (ref 0.50–1.35)
Sodium: 140 mEq/L (ref 135–145)

## 2012-06-05 ENCOUNTER — Telehealth: Payer: Self-pay | Admitting: Oncology

## 2012-06-05 ENCOUNTER — Ambulatory Visit (HOSPITAL_BASED_OUTPATIENT_CLINIC_OR_DEPARTMENT_OTHER): Payer: Medicare Other | Admitting: Oncology

## 2012-06-05 ENCOUNTER — Other Ambulatory Visit (HOSPITAL_BASED_OUTPATIENT_CLINIC_OR_DEPARTMENT_OTHER): Payer: Medicare Other | Admitting: Lab

## 2012-06-05 VITALS — BP 113/56 | HR 69 | Temp 96.8°F | Resp 20 | Ht 68.0 in | Wt 156.9 lb

## 2012-06-05 DIAGNOSIS — N289 Disorder of kidney and ureter, unspecified: Secondary | ICD-10-CM

## 2012-06-05 DIAGNOSIS — D539 Nutritional anemia, unspecified: Secondary | ICD-10-CM

## 2012-06-05 DIAGNOSIS — R599 Enlarged lymph nodes, unspecified: Secondary | ICD-10-CM

## 2012-06-05 DIAGNOSIS — N2889 Other specified disorders of kidney and ureter: Secondary | ICD-10-CM

## 2012-06-05 LAB — CBC WITH DIFFERENTIAL/PLATELET
Eosinophils Absolute: 0.1 10*3/uL (ref 0.0–0.5)
HCT: 22.6 % — ABNORMAL LOW (ref 38.4–49.9)
LYMPH%: 11.3 % — ABNORMAL LOW (ref 14.0–49.0)
MCV: 102.4 fL — ABNORMAL HIGH (ref 79.3–98.0)
MONO#: 0.5 10*3/uL (ref 0.1–0.9)
NEUT#: 7.7 10*3/uL — ABNORMAL HIGH (ref 1.5–6.5)
NEUT%: 81.8 % — ABNORMAL HIGH (ref 39.0–75.0)
Platelets: 287 10*3/uL (ref 140–400)
WBC: 9.5 10*3/uL (ref 4.0–10.3)

## 2012-06-05 LAB — COMPREHENSIVE METABOLIC PANEL (CC13)
BUN: 33 mg/dL — ABNORMAL HIGH (ref 7.0–26.0)
CO2: 23 mEq/L (ref 22–29)
Creatinine: 1.8 mg/dL — ABNORMAL HIGH (ref 0.7–1.3)
Glucose: 229 mg/dl — ABNORMAL HIGH (ref 70–99)
Total Bilirubin: 0.4 mg/dL (ref 0.20–1.20)

## 2012-06-05 NOTE — Progress Notes (Signed)
Hematology and Oncology Follow Up Visit  [name] Flynn [id number] [birth year] 78 y.o. 06/05/2012 9:45 AM  CC: [name] Purpura, MD [name] Flynn, M.D.    Principle Diagnosis: An 78 year old gentleman with following the issues: 1.  Perihilar masses on the left kidney initially diagnosed in January 2012.  He initially had a 2.4 x 3.1 left renal mass that have increased in size to 3.3 x 3.3 six months later. Biopsy was not diagnostic.  2. Macrocytic Anemia.    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 As mentioned, he presented with renal masses with biopsy failed to prove a clear cut cancer. He is on active surveillance with imaging studies and labs. Since his last visit, he has two falls with some trauma associated with them. He had a right proximal humerus fracture in 02/2102. And most recently had a fall with nasal trauma and facial lacerations. He had a lot of bleeding with it. No pain today and his bleeding has stopped. He reported no new symptoms. He does not report any back pain.  He does not report any flank pain.  He is really not reporting any constitutional symptoms.  He is not reporting any fevers.  He does not report any chills.  He does not report any night sweats.  He has not reported any lymphadenopathy.  Did not report any constitutional symptoms.  Continues to live independently.   Medications: I have reviewed the patient's current medications. Current outpatient prescriptions:allopurinol (ZYLOPRIM) 300 MG tablet, Take 300 mg by mouth daily.  , Disp: , Rfl: ;  aspirin 325 MG tablet, Take 1 tablet (325 mg total) by mouth every other day., Disp: 1 tablet, Rfl: 0;  atenolol (TENORMIN) 50 MG tablet, Take 50 mg by mouth daily.  , Disp: , Rfl: ;  colchicine 0.6 MG tablet, Take 0.6 mg by mouth daily.  , Disp: , Rfl:  ferrous sulfate 325 (65 FE) MG tablet, Take 325 mg by mouth daily with breakfast., Disp: , Rfl: ;  glimepiride (AMARYL) 2 MG tablet, Take 2 mg by  mouth daily before breakfast.  , Disp: , Rfl: ;  HYDROcodone-acetaminophen (VICODIN) 5-500 MG per tablet, Take 1-2 tablets by mouth every 6 (six) hours as needed for pain., Disp: 20 tablet, Rfl: 1;  Multiple Vitamin (MULTIVITAMIN) tablet, Take 1 tablet by mouth daily., Disp: , Rfl:  rosuvastatin (CRESTOR) 10 MG tablet, Take 10 mg by mouth daily.  , Disp: , Rfl:   Allergies: No Known Allergies  Past Medical History, Surgical history, Social history, and Family History were reviewed and updated.  Review of Systems: Constitutional:  Negative for fever, chills, night sweats, anorexia, weight loss, pain. Cardiovascular: no chest pain or dyspnea on exertion Respiratory: no cough, shortness of breath, or wheezing Neurological: no TIA or stroke symptoms Dermatological: negative ENT: negative Skin: Negative. Gastrointestinal: no abdominal pain, change in bowel habits, or black or bloody stools Genito-Urinary: no dysuria, trouble voiding, or hematuria Hematological and Lymphatic: negative Breast: negative Musculoskeletal: negative Remaining ROS negative. Physical Exam: Blood pressure 113/56, pulse 69, temperature 96.8 F (36 C), temperature source Oral, resp. rate 20, height 5\' 8"  (1.727 m), weight 156 lb 14.4 oz (71.169 kg). ECOG: 1 General appearance: alert Head: Normocephalic, without obvious abnormality, atraumatic Neck: no adenopathy, no carotid bruit, no JVD, supple, symmetrical, trachea midline and thyroid not enlarged, symmetric, no tenderness/mass/nodules Lymph nodes: Cervical, supraclavicular, and axillary nodes normal. Heart:regular rate and rhythm, S1, S2 normal, no murmur, click, rub  or gallop Lung:chest clear, no wheezing, rales, normal symmetric air entry Abdomin: soft, non-tender, without masses or organomegaly EXT:no erythema, induration, or nodules   Lab Results: Lab Results  Component Value Date   WBC 9.5 06/05/2012   HGB 7.7* 06/05/2012   HCT 22.6* 06/05/2012   MCV  102.4* 06/05/2012   PLT 287 06/05/2012     Impression and Plan: An 78 year old gentleman with following the issues: 1.  Perihilar masses on the left kidney initially diagnosed in January 2012.  He initially had a 2.4 x 3.1 left renal mass that have increased slightly in the interm. Biopsy was not diagnostic. The plan is to repeat imaging studies in 11/2012 and if the kidney masses are growing rapidly or there is an organ compromise, then will pursue an aggressive approach at that time. He agrees to proceed with a more conservative approach.   2. Macrocytic Anemia. The etiology related to early MDS vs. possible myeloproliferative disorder. SPEP and IF with QIG all normal and do not suggest a plasma cell disorder.  His anemia is slightly worse due to acute blood loss from his recent trauma. He is asymptomatic at this point and we will continue to monitor.     [facility], MD 10/18/20139:45 AM

## 2012-06-05 NOTE — Telephone Encounter (Signed)
Gave pt appt for lab and Ct , NPO 4 hours prior to CT then MD visit after a few days

## 2012-11-24 ENCOUNTER — Emergency Department (HOSPITAL_COMMUNITY)
Admission: EM | Admit: 2012-11-24 | Discharge: 2012-11-24 | Disposition: A | Discharge status: Home / Self Care | Payer: Medicare Other | Attending: Emergency Medicine | Admitting: Emergency Medicine

## 2012-11-24 ENCOUNTER — Emergency Department (HOSPITAL_COMMUNITY): Payer: Medicare Other

## 2012-11-24 ENCOUNTER — Encounter (HOSPITAL_COMMUNITY): Payer: Self-pay | Admitting: *Deleted

## 2012-11-24 DIAGNOSIS — M6281 Muscle weakness (generalized): Secondary | ICD-10-CM | POA: Insufficient documentation

## 2012-11-24 DIAGNOSIS — I2510 Atherosclerotic heart disease of native coronary artery without angina pectoris: Secondary | ICD-10-CM | POA: Insufficient documentation

## 2012-11-24 DIAGNOSIS — Z8719 Personal history of other diseases of the digestive system: Secondary | ICD-10-CM | POA: Insufficient documentation

## 2012-11-24 DIAGNOSIS — Z7982 Long term (current) use of aspirin: Secondary | ICD-10-CM | POA: Insufficient documentation

## 2012-11-24 DIAGNOSIS — R4789 Other speech disturbances: Secondary | ICD-10-CM | POA: Insufficient documentation

## 2012-11-24 DIAGNOSIS — E785 Hyperlipidemia, unspecified: Secondary | ICD-10-CM | POA: Insufficient documentation

## 2012-11-24 DIAGNOSIS — Z79899 Other long term (current) drug therapy: Secondary | ICD-10-CM | POA: Insufficient documentation

## 2012-11-24 DIAGNOSIS — I10 Essential (primary) hypertension: Secondary | ICD-10-CM | POA: Insufficient documentation

## 2012-11-24 DIAGNOSIS — E1169 Type 2 diabetes mellitus with other specified complication: Secondary | ICD-10-CM | POA: Insufficient documentation

## 2012-11-24 DIAGNOSIS — Z862 Personal history of diseases of the blood and blood-forming organs and certain disorders involving the immune mechanism: Secondary | ICD-10-CM | POA: Insufficient documentation

## 2012-11-24 DIAGNOSIS — E162 Hypoglycemia, unspecified: Secondary | ICD-10-CM

## 2012-11-24 LAB — CBC WITH DIFFERENTIAL/PLATELET
Basophils Absolute: 0 10*3/uL (ref 0.0–0.1)
HCT: 33.1 % — ABNORMAL LOW (ref 39.0–52.0)
Hemoglobin: 11.2 g/dL — ABNORMAL LOW (ref 13.0–17.0)
Lymphocytes Relative: 17 % (ref 12–46)
Monocytes Absolute: 0.7 10*3/uL (ref 0.1–1.0)
Monocytes Relative: 6 % (ref 3–12)
Neutro Abs: 9.5 10*3/uL — ABNORMAL HIGH (ref 1.7–7.7)
RBC: 3.31 MIL/uL — ABNORMAL LOW (ref 4.22–5.81)
RDW: 15.6 % — ABNORMAL HIGH (ref 11.5–15.5)
WBC: 12.4 10*3/uL — ABNORMAL HIGH (ref 4.0–10.5)

## 2012-11-24 LAB — GLUCOSE, CAPILLARY
Glucose-Capillary: 61 mg/dL — ABNORMAL LOW (ref 70–99)
Glucose-Capillary: 127 mg/dL — ABNORMAL HIGH (ref 70–99)

## 2012-11-24 LAB — COMPREHENSIVE METABOLIC PANEL
ALT: 11 U/L (ref 0–53)
AST: 30 U/L (ref 0–37)
CO2: 23 mEq/L (ref 19–32)
Chloride: 103 mEq/L (ref 96–112)
Creatinine, Ser: 1.87 mg/dL — ABNORMAL HIGH (ref 0.50–1.35)
GFR calc non Af Amer: 31 mL/min — ABNORMAL LOW (ref >90)
Total Bilirubin: 0.3 mg/dL (ref 0.3–1.2)

## 2012-11-24 LAB — URINALYSIS, ROUTINE W REFLEX MICROSCOPIC
Glucose, UA: 100 mg/dL — AB
Specific Gravity, Urine: 1.012 (ref 1.005–1.030)
pH: 7 (ref 5.0–8.0)

## 2012-11-24 LAB — PROTIME-INR: Prothrombin Time: 13.9 seconds (ref 11.6–15.2)

## 2012-11-24 MED ORDER — DEXTROSE 50 % IV SOLN
INTRAVENOUS | Status: AC
  Administered 2012-11-24: 50 mL
  Filled 2012-11-24: qty 50

## 2012-11-24 NOTE — ED Provider Notes (Signed)
History     CSN: [id number]  Arrival date & time 11/24/12  1638   First MD Initiated Contact with Patient 11/24/12 1648      Chief Complaint  Patient presents with  . Crist Infante symptoms     (Consider location/radiation/quality/duration/timing/severity/associated sxs/prior treatment) HPI  Patient presents to the emergency department. Patient states he thinks he is having a TIA today. He states about noon he started noticing numbness on the left side of his mouth and felt like he was having difficulty talking and states his voice does not sound like his usual voice. He also thought he had some weakness in his left arm but no weakness in his leg. He states heate breakfast today and about 1:30 he ate a boiled egg and orange without improvement of his symptoms. He states he took a nap after eating lunch and then went to the bank and when his symptoms persisted he called his PCP who told him to come to the ED. He states he's never had this before. He denies prior history of stroke or TIA. He states he was fine yesterday without any problems such as vomiting or diarrhea. His friend who is here states he seems sluggish to her and not his usual active self.  PCP Dr Mila Palmer  Past Medical History  Diagnosis Date  . Diabetes mellitus   . Hypertension   . Pancreatitis   . CAD (coronary artery disease)   . Hyperlipemia   . Anemia     macrocytic    Past Surgical History  Procedure Laterality Date  . Appendectomy    . Cholecystectomy    . Angioplasty    . Ureteral stent placement      Left  . Orif humerus fracture  03/07/2012    Procedure: OPEN REDUCTION INTERNAL FIXATION (ORIF) PROXIMAL HUMERUS FRACTURE;  Surgeon: Verlee Rossetti, MD;  Location: [facility] OR;  Service: Orthopedics;  Laterality: Right;    History reviewed. No pertinent family history.  History  Substance Use Topics  . Smoking status: Never Smoker   . Smokeless tobacco: Not on file  . Alcohol Use: occassional wine  widowed Lives  alone at [facility] Uses a walker to go to dining room   Review of Systems  All other systems reviewed and are negative.    Allergies  Review of patient's allergies indicates no known allergies.  Home Medications   Current Outpatient Rx  Name  Route  Sig  Dispense  Refill  . allopurinol (ZYLOPRIM) 300 MG tablet   Oral   Take 300 mg by mouth every other day.          Marland Kitchen aspirin 81 MG tablet   Oral   Take 81 mg by mouth daily.         Marland Kitchen atenolol (TENORMIN) 50 MG tablet   Oral   Take 50 mg by mouth daily.           . colchicine 0.6 MG tablet   Oral   Take 0.6 mg by mouth daily.           Marland Kitchen glimepiride (AMARYL) 2 MG tablet   Oral   Take 2 mg by mouth daily before breakfast.           . Multiple Vitamin (MULTIVITAMIN) tablet   Oral   Take 1 tablet by mouth daily.         . rosuvastatin (CRESTOR) 10 MG tablet   Oral   Take 10 mg by mouth  every other day.            BP 160/69  Pulse 67  Temp(Src) 98.6 F (37 C) (Oral)  Resp 20  SpO2 96%  Vital signs normal except mild hypertension   Physical Exam  Constitutional: He is oriented to person, place, and time. He appears well-developed and well-nourished.  Non-toxic appearance. He does not appear ill. No distress.  HENT:  Head: Normocephalic and atraumatic.  Right Ear: External ear normal.  Left Ear: External ear normal.  Nose: Nose normal. No mucosal edema or rhinorrhea.  Mouth/Throat: Oropharynx is clear and moist and mucous membranes are normal. No dental abscesses or edematous.  Eyes: Conjunctivae and EOM are normal. Pupils are equal, round, and reactive to light.  Neck: Normal range of motion and full passive range of motion without pain. Neck supple.  Cardiovascular: Normal rate, regular rhythm and normal heart sounds.  Exam reveals no gallop and no friction rub.   No murmur heard. Pulmonary/Chest: Effort normal and breath sounds normal. No respiratory distress. He has no wheezes. He has no  rhonchi. He has no rales. He exhibits no tenderness and no crepitus.  Abdominal: Soft. Normal appearance and bowel sounds are normal. He exhibits no distension. There is no tenderness. There is no rebound and no guarding.  Musculoskeletal: Normal range of motion. He exhibits no edema and no tenderness.  Moves all extremities well.   Neurological: He is alert and oriented to person, place, and time. He has normal strength. No cranial nerve deficit.  No cranial nerve deficit noted, patient has no upper or lower extremity weakness. His grips are equal bilaterally. No pronator drift is present. Patient's speech appears normal to me. Patient is fully alert and oriented x4  Skin: Skin is warm, dry and intact. No rash noted. No erythema. No pallor.  Psychiatric: He has a normal mood and affect. His speech is normal and behavior is normal. His mood appears not anxious.    ED Course  Procedures (including critical care time)  Medications  dextrose 50 % solution (50 mLs  Given 11/24/12 1652)   Pt given 1 amp of D50 on arrival for his hypoglycemia.   Initial stroke score was "0". Code stroke not called.   PT states he is back to his baseline and his friend agrees. PT was able to ambulate in the ED with nursing assistance. He states he ate lunch later than usual today and ate less than he usually does.   Pt ready to go home, to see his PCP this week.     Results for orders placed during the hospital encounter of 11/24/12  GLUCOSE, CAPILLARY      Result Value Range   Glucose-Capillary 61 (*) 70 - 99 mg/dL  CBC WITH DIFFERENTIAL      Result Value Range   WBC 12.4 (*) 4.0 - 10.5 K/uL   RBC 3.31 (*) 4.22 - 5.81 MIL/uL   Hemoglobin 11.2 (*) 13.0 - 17.0 g/dL   HCT 65.7 (*) 84.6 - 96.2 %   MCV 100.0  78.0 - 100.0 fL   MCH 33.8  26.0 - 34.0 pg   MCHC 33.8  30.0 - 36.0 g/dL   RDW 95.2 (*) 84.1 - 32.4 %   Platelets 261  150 - 400 K/uL   Neutrophils Relative 76  43 - 77 %   Neutro Abs 9.5 (*) 1.7 -  7.7 K/uL   Lymphocytes Relative 17  12 - 46 %   Lymphs Abs  2.2  0.7 - 4.0 K/uL   Monocytes Relative 6  3 - 12 %   Monocytes Absolute 0.7  0.1 - 1.0 K/uL   Eosinophils Relative 1  0 - 5 %   Eosinophils Absolute 0.1  0.0 - 0.7 K/uL   Basophils Relative 0  0 - 1 %   Basophils Absolute 0.0  0.0 - 0.1 K/uL  COMPREHENSIVE METABOLIC PANEL      Result Value Range   Sodium 129 (*) 135 - 145 mEq/L   Potassium 4.4  3.5 - 5.1 mEq/L   Chloride 103  96 - 112 mEq/L   CO2 23  19 - 32 mEq/L   Glucose, Bld 58 (*) 70 - 99 mg/dL   BUN 31 (*) 6 - 23 mg/dL   Creatinine, Ser 1.61 (*) 0.50 - 1.35 mg/dL   Calcium 8.6  8.4 - 09.6 mg/dL   Total Protein 8.4 (*) 6.0 - 8.3 g/dL   Albumin 3.2 (*) 3.5 - 5.2 g/dL   AST 30  0 - 37 U/L   ALT 11  0 - 53 U/L   Alkaline Phosphatase 77  39 - 117 U/L   Total Bilirubin 0.3  0.3 - 1.2 mg/dL   GFR calc non Af Amer 31 (*) >90 mL/min   GFR calc Af Amer 36 (*) >90 mL/min  APTT      Result Value Range   aPTT 33  24 - 37 seconds  PROTIME-INR      Result Value Range   Prothrombin Time 13.9  11.6 - 15.2 seconds   INR 1.08  0.00 - 1.49  TROPONIN I      Result Value Range   Troponin I <0.30  <0.30 ng/mL  URINALYSIS, ROUTINE W REFLEX MICROSCOPIC      Result Value Range   Color, Urine YELLOW  YELLOW   APPearance CLEAR  CLEAR   Specific Gravity, Urine 1.012  1.005 - 1.030   pH 7.0  5.0 - 8.0   Glucose, UA 100 (*) NEGATIVE mg/dL   Hgb urine dipstick TRACE (*) NEGATIVE   Bilirubin Urine NEGATIVE  NEGATIVE   Ketones, ur NEGATIVE  NEGATIVE mg/dL   Protein, ur 045 (*) NEGATIVE mg/dL   Urobilinogen, UA 0.2  0.0 - 1.0 mg/dL   Nitrite NEGATIVE  NEGATIVE   Leukocytes, UA NEGATIVE  NEGATIVE  URINE MICROSCOPIC-ADD ON      Result Value Range   RBC / HPF 0-2  <3 RBC/hpf   Bacteria, UA FEW (*) RARE   Urine-Other MUCOUS PRESENT    GLUCOSE, CAPILLARY      Result Value Range   Glucose-Capillary 127 (*) 70 - 99 mg/dL   Laboratory interpretation all normal except hypoglycemia,  mild anemia    Mr Brain Wo Contrast  11/24/2012  *RADIOLOGY REPORT*  Clinical Data: Left upper extremity numbness and weakness.  MRI HEAD WITHOUT CONTRAST  Technique:  Multiplanar, multiecho pulse sequences of the brain and surrounding structures were obtained according to standard protocol without intravenous contrast.  Comparison: CT head without contrast 05/30/2012.  Findings: The diffusion weighted images demonstrate no evidence for acute or subacute infarction.  A remote infarct with probable micro hemorrhage is present in the left pons.  White matter changes extend into the left cerebral peduncle.  Advanced atrophy white matter disease is present bilaterally. There are dilated perivascular spaces in the basal ganglia bilaterally.  Remote lacunar infarction present in the basal ganglia as well.  The ventricles are proportionate to the degree  of atrophy.  Flow is present in the major intracranial arteries.  The right vertebral artery is hypoplastic.  The globes and orbits are intact. Mild mucosal thickening is present in the left maxillary sinus. There is mild mucosal thickening along the floor of the right maxillary sinus.  Mild fluid is present in the mastoid air cells bilaterally.  No obstructing nasopharyngeal lesion is evident.  IMPRESSION:  1.  No acute intracranial abnormality. 2.  Advanced atrophy and white matter disease. 3.  Remote lacunar infarcts of the basal ganglia bilaterally. 4.  Remote infarct of the left pons with white matter changes extending into the left cerebral peduncle.   Original Report Authenticated By: Marin Roberts, M.D.       Date: 11/24/2012  Rate: 67  Rhythm: normal sinus rhythm  QRS Axis: normal  Intervals: normal  ST/T Wave abnormalities: normal  Conduction Disutrbances:RVH  Narrative Interpretation:   Old EKG Reviewed: changes noted from 03/07/2012, HR was 55    1. Hypoglycemia      Plan discharge  Devoria Albe, MD, FACEP   MDM PT has symptoms  resembling a stroke, however his symptoms improved when his mild hypoglycemia was corrected. He is back to his baseline now.           Ward Givens, MD 11/24/12 [phone]

## 2012-11-24 NOTE — ED Notes (Signed)
MRI to come get pt in 15 min, pt reports he is not claustrophobic.

## 2012-11-24 NOTE — ED Notes (Signed)
md at bedside

## 2012-11-24 NOTE — ED Notes (Signed)
Pt to MRI

## 2012-11-24 NOTE — ED Notes (Signed)
Pt attempting to urinate at this time

## 2012-11-24 NOTE — ED Notes (Signed)
md at bedside  Pt alert and oriented x4. Respirations even and unlabored, bilateral symmetrical rise and fall of chest. Skin warm and dry. In no acute distress. Denies needs.   

## 2012-11-24 NOTE — ED Notes (Signed)
Pt reports around 12 noon left hand numbness and tingling. Negative for stroke scale. No facial droop noted. Denies pain at this time.

## 2012-11-24 NOTE — ED Notes (Signed)
Pt back from MRI 

## 2012-12-01 ENCOUNTER — Other Ambulatory Visit (HOSPITAL_BASED_OUTPATIENT_CLINIC_OR_DEPARTMENT_OTHER): Payer: Medicare Other | Admitting: Lab

## 2012-12-01 ENCOUNTER — Encounter (HOSPITAL_COMMUNITY): Payer: Self-pay

## 2012-12-01 ENCOUNTER — Ambulatory Visit (HOSPITAL_COMMUNITY)
Admission: RE | Admit: 2012-12-01 | Discharge: 2012-12-01 | Disposition: A | Discharge status: Home / Self Care | Payer: Medicare Other | Source: Ambulatory Visit | Attending: Oncology | Admitting: Oncology

## 2012-12-01 ENCOUNTER — Other Ambulatory Visit: Payer: Self-pay | Admitting: Oncology

## 2012-12-01 DIAGNOSIS — M439 Deforming dorsopathy, unspecified: Secondary | ICD-10-CM | POA: Insufficient documentation

## 2012-12-01 DIAGNOSIS — K6389 Other specified diseases of intestine: Secondary | ICD-10-CM | POA: Insufficient documentation

## 2012-12-01 DIAGNOSIS — N400 Enlarged prostate without lower urinary tract symptoms: Secondary | ICD-10-CM | POA: Insufficient documentation

## 2012-12-01 DIAGNOSIS — Q762 Congenital spondylolisthesis: Secondary | ICD-10-CM | POA: Insufficient documentation

## 2012-12-01 DIAGNOSIS — Q348 Other specified congenital malformations of respiratory system: Secondary | ICD-10-CM | POA: Insufficient documentation

## 2012-12-01 DIAGNOSIS — N289 Disorder of kidney and ureter, unspecified: Secondary | ICD-10-CM | POA: Insufficient documentation

## 2012-12-01 DIAGNOSIS — I700 Atherosclerosis of aorta: Secondary | ICD-10-CM | POA: Insufficient documentation

## 2012-12-01 DIAGNOSIS — N2889 Other specified disorders of kidney and ureter: Secondary | ICD-10-CM

## 2012-12-01 DIAGNOSIS — D539 Nutritional anemia, unspecified: Secondary | ICD-10-CM | POA: Insufficient documentation

## 2012-12-01 DIAGNOSIS — N3289 Other specified disorders of bladder: Secondary | ICD-10-CM | POA: Insufficient documentation

## 2012-12-01 DIAGNOSIS — J8410 Pulmonary fibrosis, unspecified: Secondary | ICD-10-CM | POA: Insufficient documentation

## 2012-12-01 DIAGNOSIS — K8689 Other specified diseases of pancreas: Secondary | ICD-10-CM | POA: Insufficient documentation

## 2012-12-01 DIAGNOSIS — Z8719 Personal history of other diseases of the digestive system: Secondary | ICD-10-CM | POA: Insufficient documentation

## 2012-12-01 DIAGNOSIS — K5730 Diverticulosis of large intestine without perforation or abscess without bleeding: Secondary | ICD-10-CM | POA: Insufficient documentation

## 2012-12-01 DIAGNOSIS — J984 Other disorders of lung: Secondary | ICD-10-CM | POA: Insufficient documentation

## 2012-12-01 DIAGNOSIS — I2510 Atherosclerotic heart disease of native coronary artery without angina pectoris: Secondary | ICD-10-CM | POA: Insufficient documentation

## 2012-12-01 DIAGNOSIS — K4090 Unilateral inguinal hernia, without obstruction or gangrene, not specified as recurrent: Secondary | ICD-10-CM | POA: Insufficient documentation

## 2012-12-01 DIAGNOSIS — Z9089 Acquired absence of other organs: Secondary | ICD-10-CM | POA: Insufficient documentation

## 2012-12-01 LAB — COMPREHENSIVE METABOLIC PANEL (CC13)
ALT: 9 U/L (ref 0–55)
CO2: 26 mEq/L (ref 22–29)
Calcium: 9.2 mg/dL (ref 8.4–10.4)
Chloride: 102 mEq/L (ref 98–107)
Creatinine: 2.2 mg/dL — ABNORMAL HIGH (ref 0.7–1.3)
Glucose: 169 mg/dl — ABNORMAL HIGH (ref 70–99)

## 2012-12-01 LAB — CBC WITH DIFFERENTIAL/PLATELET
BASO%: 0.3 % (ref 0.0–2.0)
Basophils Absolute: 0 10*3/uL (ref 0.0–0.1)
Eosinophils Absolute: 0.1 10*3/uL (ref 0.0–0.5)
HCT: 33.1 % — ABNORMAL LOW (ref 38.4–49.9)
HGB: 10.9 g/dL — ABNORMAL LOW (ref 13.0–17.1)
LYMPH%: 15.3 % (ref 14.0–49.0)
MCHC: 32.9 g/dL (ref 32.0–36.0)
MONO#: 0.4 10*3/uL (ref 0.1–0.9)
NEUT#: 7.3 10*3/uL — ABNORMAL HIGH (ref 1.5–6.5)
NEUT%: 79.1 % — ABNORMAL HIGH (ref 39.0–75.0)
Platelets: 281 10*3/uL (ref 140–400)
WBC: 9.3 10*3/uL (ref 4.0–10.3)
lymph#: 1.4 10*3/uL (ref 0.9–3.3)

## 2012-12-03 ENCOUNTER — Ambulatory Visit: Payer: Medicare Other | Admitting: Oncology

## 2012-12-03 ENCOUNTER — Telehealth: Payer: Self-pay | Admitting: Oncology

## 2012-12-07 ENCOUNTER — Other Ambulatory Visit: Payer: Self-pay | Admitting: Internal Medicine

## 2012-12-07 DIAGNOSIS — N1330 Unspecified hydronephrosis: Secondary | ICD-10-CM

## 2012-12-08 ENCOUNTER — Ambulatory Visit
Admission: RE | Admit: 2012-12-08 | Discharge: 2012-12-08 | Disposition: A | Discharge status: Home / Self Care | Payer: Medicare Other | Source: Ambulatory Visit | Attending: Internal Medicine | Admitting: Internal Medicine

## 2012-12-08 DIAGNOSIS — N1330 Unspecified hydronephrosis: Secondary | ICD-10-CM

## 2012-12-10 ENCOUNTER — Telehealth: Payer: Self-pay | Admitting: Oncology

## 2012-12-10 ENCOUNTER — Ambulatory Visit (HOSPITAL_BASED_OUTPATIENT_CLINIC_OR_DEPARTMENT_OTHER): Payer: Medicare Other | Admitting: Oncology

## 2012-12-10 VITALS — BP 143/77 | HR 70 | Temp 97.4°F | Resp 20 | Ht 68.0 in | Wt 165.2 lb

## 2012-12-10 DIAGNOSIS — D49519 Neoplasm of unspecified behavior of unspecified kidney: Secondary | ICD-10-CM

## 2012-12-10 DIAGNOSIS — D539 Nutritional anemia, unspecified: Secondary | ICD-10-CM

## 2012-12-10 DIAGNOSIS — N289 Disorder of kidney and ureter, unspecified: Secondary | ICD-10-CM

## 2012-12-10 NOTE — Telephone Encounter (Signed)
GV AND PRINTED APPT SCHED AND AVS FOR PT FOR oct °

## 2012-12-10 NOTE — Progress Notes (Signed)
Hematology and Oncology Follow Up Visit  [name] Flynn [id number] [birth year] 79 y.o. 12/10/2012 9:15 AM  CC: [name], MD [name], M.D.    Principle Diagnosis: An 79 year old gentleman with following the issues:  1.  Perihilar masses on the left kidney initially diagnosed in January 2012.  He initially had a 2.4 x 3.1 left renal mass that have increased in size to 3.3 x 3.3 six months later. Biopsy was not diagnostic.  2. Macrocytic Anemia.    Interim History:  [name] Flynn presents today for a follow up visit. As mentioned, he presented with renal masses with biopsy failed to prove a clear cut cancer. He is on active surveillance with imaging studies and labs. Since his last visit, he reported no new symptoms. He does not report any back pain.  He does not report any flank pain.  He is really not reporting any constitutional symptoms.  He is not reporting any fevers.  He does not report any chills.  He does not report any night sweats.  He has not reported any lymphadenopathy.  Did not report any constitutional symptoms.  Continues to live independently.   Medications: I have reviewed the patient's current medications. Current outpatient prescriptions:allopurinol (ZYLOPRIM) 300 MG tablet, Take 300 mg by mouth every other day. , Disp: , Rfl: ;  aspirin 81 MG tablet, Take 81 mg by mouth daily., Disp: , Rfl: ;  atenolol (TENORMIN) 50 MG tablet, Take 50 mg by mouth daily.  , Disp: , Rfl: ;  colchicine 0.6 MG tablet, Take 0.6 mg by mouth daily.  , Disp: , Rfl: ;  glimepiride (AMARYL) 2 MG tablet, Take 2 mg by mouth daily before breakfast.  , Disp: , Rfl:  Multiple Vitamin (MULTIVITAMIN) tablet, Take 1 tablet by mouth daily., Disp: , Rfl: ;  rosuvastatin (CRESTOR) 10 MG tablet, Take 10 mg by mouth every other day. , Disp: , Rfl: ;  senna (SENOKOT) 8.6 MG TABS, Take 1 tablet by mouth daily., Disp: , Rfl:   Allergies: No Known Allergies  Past Medical History, Surgical history, Social  history, and Family History were reviewed and updated.  Review of Systems: Constitutional:  Negative for fever, chills, night sweats, anorexia, weight loss, pain. Cardiovascular: no chest pain or dyspnea on exertion Respiratory: no cough, shortness of breath, or wheezing Neurological: no TIA or stroke symptoms Dermatological: negative ENT: negative Skin: Negative. Gastrointestinal: no abdominal pain, change in bowel habits, or black or bloody stools Genito-Urinary: no dysuria, trouble voiding, or hematuria Hematological and Lymphatic: negative Breast: negative Musculoskeletal: negative Remaining ROS negative. Physical Exam: Blood pressure 143/77, pulse 70, temperature 97.4 F (36.3 C), temperature source Oral, resp. rate 20, height 5\' 8"  (1.727 m), weight 165 lb 3.2 oz (74.934 kg). ECOG: 1 General appearance: alert Head: Normocephalic, without obvious abnormality, atraumatic Neck: no adenopathy, no carotid bruit, no JVD, supple, symmetrical, trachea midline and thyroid not enlarged, symmetric, no tenderness/mass/nodules Lymph nodes: Cervical, supraclavicular, and axillary nodes normal. Heart:regular rate and rhythm, S1, S2 normal, no murmur, click, rub or gallop Lung:chest clear, no wheezing, rales, normal symmetric air entry Abdomin: soft, non-tender, without masses or organomegaly EXT:no erythema, induration, or nodules   Lab Results: Lab Results  Component Value Date   WBC 9.3 12/01/2012   HGB 10.9* 12/01/2012   HCT 33.1* 12/01/2012   MCV 98.8* 12/01/2012   PLT 281 12/01/2012   CHEST, ABDOMEN AND PELVIS WITHOUT CONTRAST  Technique: Multidetector CT imaging of the chest, abdomen and  pelvis  was performed following the standard protocol without IV  contrast.  Comparison: CT chest abdomen pelvis dated 12/02/2011  CT CHEST  Findings: Subpleural reticulation/fibrosis in the lungs  bilaterally. Calcified granulomata in the right middle and lower  lobes. No suspicious pulmonary  nodules. No pleural effusion or  pneumothorax.  Visualized thyroid is unremarkable.  The heart is normal in size. No pericardial effusion. Coronary  atherosclerosis. Atherosclerotic calcifications of the aortic  arch.  Stable 2.4 x 3.2 cm benign-appearing fluid density lesion/cyst in  the superior mediastinum (series 2/image 24).  10 mm short axis precarinal node (series 2/image 24) and 14 mm  short-axis subcarinal node (series 2/image 28), grossly unchanged.  14 mm short-axis right axillary node with preservation of the  normal fatty hilum (series 2/image 12).  Degenerative changes of the visualized thoracolumbar spine. Mild  inferior endplate compression deformity at T6, unchanged.  IMPRESSION:  No evidence of acute cardiopulmonary disease.  Subpleural reticulation/fibrosis in the bilateral lungs, possibly  reflecting mild chronic interstitial lung disease.  Mildly prominent mediastinal lymph nodes, grossly unchanged,  possibly reactive.  CT ABDOMEN AND PELVIS  Findings: Unenhanced liver, spleen, and adrenal glands are within  normal limits.  Pancreatic atrophy.  Status post cholecystectomy. No intrahepatic or extrahepatic  ductal dilatation.  Bilateral lobulated perirenal masses, progressed, as follows:  --2.5 x 5.7 x 4.5 cm mass posterior to the left interpolar kidney  (series 2/image 66), previously 1.6 x 5.5 x 3.5 cm  --3.7 x 5.2 x 5.1 cm mass in the right renal sinus (series 2/image  70), previously 2.5 x 3.8 x 3.3 cm  --5.0 x 5.8 x 8.7 cm mass in the left renal sinus/along the left  proximal ureter (series 2/image 73), previously 3.8 x 4.5 x 6.5 cm  Despite the close association of the renal sinus masses with the  bilateral proximal collecting systems, there is no evidence of  hydronephrosis.  Associated mild nodularity along the bilateral perirenal fascia,  for example measuring up to 10 mm posteriorly on the left (series  2/image 81), previously 8 mm.  Small  mesenteric/retroperitoneal nodes, including a 9 x 12 mm  nodule in the central jejunal mesentery (series 2/image 82),  possibly new.  No evidence of bowel obstruction. Colonic diverticulosis, without  associated inflammatory changes.  Atherosclerotic calcifications of the abdominal aorta and branch  vessels. Displaced intimal calcification (series 2/image 62),  related to known chronic dissection or PAU.  No abdominopelvic ascites.  Prostatomegaly, measuring 5.9 cm in transverse dimension, with  prior TURP defect (series 2/image 112).  Mild bladder wall thickening.  Moderate fat-containing right inguinal hernias.  Degenerative changes of the visualized thoracolumbar spine. Stable  alignment with grade 1 anterolisthesis of L4 on L5 and L5 on S1.  IMPRESSION:  Progression of bilateral perirenal masses, as described above. No  hydronephrosis.  Associated mild nodularity along the bilateral perirenal fascia and  in the abdominal mesentery/retroperitoneum.  This appearance remains highly suspicious for a lymphoproliferative  disorder such as lymphoma or chronic lymphocytic leukemia.    Impression and Plan: An 79 year old gentleman with following the issues: 1.  Perihilar masses on the left kidney initially diagnosed in January 2012.  He initially had a 2.4 x 3.1 left renal mass that have increased slightly in the interm. Biopsy was not diagnostic. CT scan from 4/15 was discussed today and did not show dramatic changes at this time.The plan is to repeat imaging studies in 11/2013 and if the kidney masses are growing rapidly or there  is an organ compromise, then will pursue an aggressive approach at that time. He agrees to proceed with a more conservative approach.   2. Macrocytic Anemia. The etiology related to early MDS vs. possible myeloproliferative disorder. SPEP and IF with QIG all normal and do not suggest a plasma cell disorder. This is better at this point.     [facility],  MD 4/24/20149:15 AM

## 2013-06-11 ENCOUNTER — Ambulatory Visit (HOSPITAL_BASED_OUTPATIENT_CLINIC_OR_DEPARTMENT_OTHER): Payer: Medicare Other | Admitting: Oncology

## 2013-06-11 ENCOUNTER — Telehealth: Payer: Self-pay | Admitting: Oncology

## 2013-06-11 ENCOUNTER — Other Ambulatory Visit (HOSPITAL_BASED_OUTPATIENT_CLINIC_OR_DEPARTMENT_OTHER): Payer: Medicare Other

## 2013-06-11 VITALS — BP 97/53 | HR 70 | Temp 97.0°F | Resp 20 | Ht 68.0 in | Wt 162.5 lb

## 2013-06-11 DIAGNOSIS — D49519 Neoplasm of unspecified behavior of unspecified kidney: Secondary | ICD-10-CM

## 2013-06-11 DIAGNOSIS — N289 Disorder of kidney and ureter, unspecified: Secondary | ICD-10-CM

## 2013-06-11 DIAGNOSIS — D539 Nutritional anemia, unspecified: Secondary | ICD-10-CM

## 2013-06-11 DIAGNOSIS — N2889 Other specified disorders of kidney and ureter: Secondary | ICD-10-CM

## 2013-06-11 LAB — CBC WITH DIFFERENTIAL/PLATELET
BASO%: 0.7 % (ref 0.0–2.0)
Eosinophils Absolute: 0.1 10*3/uL (ref 0.0–0.5)
HCT: 24.6 % — ABNORMAL LOW (ref 38.4–49.9)
MCHC: 33.5 g/dL (ref 32.0–36.0)
MONO#: 0.3 10*3/uL (ref 0.1–0.9)
NEUT#: 7.1 10*3/uL — ABNORMAL HIGH (ref 1.5–6.5)
NEUT%: 81.8 % — ABNORMAL HIGH (ref 39.0–75.0)
RBC: 2.41 10*6/uL — ABNORMAL LOW (ref 4.20–5.82)
WBC: 8.6 10*3/uL (ref 4.0–10.3)
lymph#: 1.1 10*3/uL (ref 0.9–3.3)

## 2013-06-11 LAB — COMPREHENSIVE METABOLIC PANEL (CC13)
ALT: 10 U/L (ref 0–55)
Anion Gap: 9 mEq/L (ref 3–11)
CO2: 21 mEq/L — ABNORMAL LOW (ref 22–29)
Calcium: 9.1 mg/dL (ref 8.4–10.4)
Chloride: 109 mEq/L (ref 98–109)
Sodium: 138 mEq/L (ref 136–145)
Total Protein: 8.4 g/dL — ABNORMAL HIGH (ref 6.4–8.3)

## 2013-06-11 NOTE — Telephone Encounter (Signed)
Gave pt appt for lab and MD on April 2015 also gave pt oral contrast

## 2013-06-11 NOTE — Progress Notes (Signed)
Hematology and Oncology Follow Up Visit  [name] Flynn [id number] 1924/05/01 79 y.o. 06/11/2013 10:19 AM  CC: [name], MD [name], M.D.    Principle Diagnosis: An 79 year old gentleman with following the issues:  1.  Perihilar masses on the left kidney initially diagnosed in January 2012.  He initially had a 2.4 x 3.1 left renal mass that have increased in size to 3.3 x 3.3 six months later. Biopsy was not diagnostic.  2. Macrocytic Anemia.    Interim History:  [name] Flynn presents today for a follow up visit. As mentioned, he presented with renal masses with biopsy failed to prove a clear cut cancer. He is on active surveillance with imaging studies and labs. Since his last visit, he reported no new symptoms. He does not report any back pain.  He does not report any flank pain.  He is really not reporting any constitutional symptoms.  He is not reporting any fevers.  He does not report any chills.  He does not report any night sweats.  He has not reported any lymphadenopathy.  Did not report any constitutional symptoms.  Continues to live independently.  No bleeding or change in his clinical status.   Medications: I have reviewed the patient's current medications. Current Outpatient Prescriptions  Medication Sig Dispense Refill  . allopurinol (ZYLOPRIM) 300 MG tablet Take 300 mg by mouth every other day.       Marland Kitchen aspirin 81 MG tablet Take 81 mg by mouth daily.      Marland Kitchen atenolol (TENORMIN) 50 MG tablet Take 50 mg by mouth daily.        Marland Kitchen glimepiride (AMARYL) 2 MG tablet Take 2 mg by mouth daily before breakfast.        . Multiple Vitamin (MULTIVITAMIN) tablet Take 1 tablet by mouth daily.      . rosuvastatin (CRESTOR) 10 MG tablet Take 10 mg by mouth every other day.        No current facility-administered medications for this visit.     Allergies: No Known Allergies  Past Medical History, Surgical history, Social history, and Family History were reviewed and  updated.  Review of Systems:  Remaining ROS negative. Physical Exam: Blood pressure 97/53, pulse 70, temperature 97 F (36.1 C), temperature source Oral, resp. rate 20, height 5\' 8"  (1.727 m), weight 162 lb 8 oz (73.71 kg). ECOG: 1 General appearance: alert Head: Normocephalic, without obvious abnormality, atraumatic Neck: no adenopathy, no carotid bruit, no JVD, supple, symmetrical, trachea midline and thyroid not enlarged, symmetric, no tenderness/mass/nodules Lymph nodes: Cervical, supraclavicular, and axillary nodes normal. Heart:regular rate and rhythm, S1, S2 normal, no murmur, click, rub or gallop Lung:chest clear, no wheezing, rales, normal symmetric air entry Abdomin: soft, non-tender, without masses or organomegaly EXT:no erythema, induration, or nodules   Lab Results: Lab Results  Component Value Date   WBC 8.6 06/11/2013   HGB 8.3* 06/11/2013   HCT 24.6* 06/11/2013   MCV 102.2* 06/11/2013   PLT 224 06/11/2013    Impression and Plan: An 79 year old gentleman with following the issues: 1.  Perihilar masses on the left kidney initially diagnosed in January 2012.  He initially had a 2.4 x 3.1 left renal mass that have increased slightly in the interm. Biopsy was not diagnostic. CT scan from 4/15  did not show dramatic changes at this time.The plan is to repeat imaging studies in 11/2013 and if the kidney masses are growing rapidly or there is an organ compromise, then will  pursue an aggressive approach at that time. He agrees to proceed with a more conservative approach.   2. Macrocytic Anemia. The etiology related to early MDS vs. possible myeloproliferative disorder. SPEP and IF with QIG all normal and do not suggest a plasma cell disorder. We will continue to monitor his blood counts in future visits.     Idil Maslanka, MD 10/24/201410:19 AM

## 2013-08-25 ENCOUNTER — Encounter (HOSPITAL_COMMUNITY): Payer: Self-pay | Admitting: Emergency Medicine

## 2013-08-25 ENCOUNTER — Inpatient Hospital Stay (HOSPITAL_COMMUNITY): Payer: Medicare Other

## 2013-08-25 ENCOUNTER — Inpatient Hospital Stay (HOSPITAL_COMMUNITY)
Admission: EM | Admit: 2013-08-25 | Discharge: 2013-09-01 | DRG: 811 | Disposition: A | Discharge status: Home Health | Payer: Medicare Other | Attending: Internal Medicine | Admitting: Internal Medicine

## 2013-08-25 DIAGNOSIS — N135 Crossing vessel and stricture of ureter without hydronephrosis: Secondary | ICD-10-CM | POA: Diagnosis present

## 2013-08-25 DIAGNOSIS — D649 Anemia, unspecified: Secondary | ICD-10-CM

## 2013-08-25 DIAGNOSIS — E162 Hypoglycemia, unspecified: Secondary | ICD-10-CM

## 2013-08-25 DIAGNOSIS — E785 Hyperlipidemia, unspecified: Secondary | ICD-10-CM | POA: Diagnosis present

## 2013-08-25 DIAGNOSIS — D469 Myelodysplastic syndrome, unspecified: Principal | ICD-10-CM | POA: Diagnosis present

## 2013-08-25 DIAGNOSIS — D539 Nutritional anemia, unspecified: Secondary | ICD-10-CM | POA: Diagnosis present

## 2013-08-25 DIAGNOSIS — N058 Unspecified nephritic syndrome with other morphologic changes: Secondary | ICD-10-CM

## 2013-08-25 DIAGNOSIS — R531 Weakness: Secondary | ICD-10-CM

## 2013-08-25 DIAGNOSIS — J9600 Acute respiratory failure, unspecified whether with hypoxia or hypercapnia: Secondary | ICD-10-CM | POA: Diagnosis not present

## 2013-08-25 DIAGNOSIS — I2789 Other specified pulmonary heart diseases: Secondary | ICD-10-CM | POA: Diagnosis present

## 2013-08-25 DIAGNOSIS — R42 Dizziness and giddiness: Secondary | ICD-10-CM

## 2013-08-25 DIAGNOSIS — E1169 Type 2 diabetes mellitus with other specified complication: Secondary | ICD-10-CM | POA: Diagnosis present

## 2013-08-25 DIAGNOSIS — I129 Hypertensive chronic kidney disease with stage 1 through stage 4 chronic kidney disease, or unspecified chronic kidney disease: Secondary | ICD-10-CM | POA: Diagnosis present

## 2013-08-25 DIAGNOSIS — N179 Acute kidney failure, unspecified: Secondary | ICD-10-CM | POA: Diagnosis not present

## 2013-08-25 DIAGNOSIS — Z7982 Long term (current) use of aspirin: Secondary | ICD-10-CM

## 2013-08-25 DIAGNOSIS — N184 Chronic kidney disease, stage 4 (severe): Secondary | ICD-10-CM | POA: Diagnosis present

## 2013-08-25 DIAGNOSIS — D47Z9 Other specified neoplasms of uncertain behavior of lymphoid, hematopoietic and related tissue: Secondary | ICD-10-CM | POA: Diagnosis present

## 2013-08-25 DIAGNOSIS — N1330 Unspecified hydronephrosis: Secondary | ICD-10-CM | POA: Diagnosis present

## 2013-08-25 DIAGNOSIS — Z79899 Other long term (current) drug therapy: Secondary | ICD-10-CM

## 2013-08-25 DIAGNOSIS — N289 Disorder of kidney and ureter, unspecified: Secondary | ICD-10-CM | POA: Diagnosis present

## 2013-08-25 DIAGNOSIS — N189 Chronic kidney disease, unspecified: Secondary | ICD-10-CM

## 2013-08-25 DIAGNOSIS — I5033 Acute on chronic diastolic (congestive) heart failure: Secondary | ICD-10-CM | POA: Diagnosis present

## 2013-08-25 DIAGNOSIS — E1129 Type 2 diabetes mellitus with other diabetic kidney complication: Secondary | ICD-10-CM | POA: Diagnosis present

## 2013-08-25 DIAGNOSIS — J8410 Pulmonary fibrosis, unspecified: Secondary | ICD-10-CM | POA: Diagnosis present

## 2013-08-25 DIAGNOSIS — I10 Essential (primary) hypertension: Secondary | ICD-10-CM

## 2013-08-25 DIAGNOSIS — I509 Heart failure, unspecified: Secondary | ICD-10-CM | POA: Diagnosis present

## 2013-08-25 DIAGNOSIS — I2510 Atherosclerotic heart disease of native coronary artery without angina pectoris: Secondary | ICD-10-CM | POA: Diagnosis present

## 2013-08-25 DIAGNOSIS — J9601 Acute respiratory failure with hypoxia: Secondary | ICD-10-CM

## 2013-08-25 DIAGNOSIS — E11649 Type 2 diabetes mellitus with hypoglycemia without coma: Secondary | ICD-10-CM | POA: Diagnosis not present

## 2013-08-25 DIAGNOSIS — Z66 Do not resuscitate: Secondary | ICD-10-CM | POA: Diagnosis present

## 2013-08-25 HISTORY — DX: Type 2 diabetes mellitus with other diabetic kidney complication: E11.29

## 2013-08-25 HISTORY — DX: Chronic kidney disease, stage 4 (severe): N18.4

## 2013-08-25 LAB — COMPREHENSIVE METABOLIC PANEL
ALK PHOS: 68 U/L (ref 39–117)
ALT: 15 U/L (ref 0–53)
AST: 14 U/L (ref 0–37)
Albumin: 2.9 g/dL — ABNORMAL LOW (ref 3.5–5.2)
BILIRUBIN TOTAL: 0.3 mg/dL (ref 0.3–1.2)
BUN: 49 mg/dL — AB (ref 6–23)
CHLORIDE: 102 meq/L (ref 96–112)
CO2: 17 meq/L — AB (ref 19–32)
Calcium: 8.4 mg/dL (ref 8.4–10.5)
Creatinine, Ser: 3.14 mg/dL — ABNORMAL HIGH (ref 0.50–1.35)
GFR calc non Af Amer: 16 mL/min — ABNORMAL LOW (ref >90)
GFR, EST AFRICAN AMERICAN: 19 mL/min — AB (ref >90)
GLUCOSE: 141 mg/dL — AB (ref 70–99)
POTASSIUM: 4.5 meq/L (ref 3.7–5.3)
SODIUM: 134 meq/L — AB (ref 137–147)
Total Protein: 7.6 g/dL (ref 6.0–8.3)

## 2013-08-25 LAB — CBC
HCT: 19.5 % — ABNORMAL LOW (ref 39.0–52.0)
HEMOGLOBIN: 6.4 g/dL — AB (ref 13.0–17.0)
MCH: 34.2 pg — AB (ref 26.0–34.0)
MCHC: 32.8 g/dL (ref 30.0–36.0)
MCV: 104.3 fL — AB (ref 78.0–100.0)
Platelets: 207 10*3/uL (ref 150–400)
RBC: 1.87 MIL/uL — ABNORMAL LOW (ref 4.22–5.81)
RDW: 17 % — ABNORMAL HIGH (ref 11.5–15.5)
WBC: 8.7 10*3/uL (ref 4.0–10.5)

## 2013-08-25 LAB — OCCULT BLOOD, POC DEVICE: Fecal Occult Bld: NEGATIVE

## 2013-08-25 LAB — IRON AND TIBC
IRON: 30 ug/dL — AB (ref 42–135)
SATURATION RATIOS: 11 % — AB (ref 20–55)
TIBC: 277 ug/dL (ref 215–435)
UIBC: 247 ug/dL (ref 125–400)

## 2013-08-25 LAB — HEMOGLOBIN AND HEMATOCRIT, BLOOD
HCT: 19.6 % — ABNORMAL LOW (ref 39.0–52.0)
HCT: 28.3 % — ABNORMAL LOW (ref 39.0–52.0)
Hemoglobin: 6.4 g/dL — CL (ref 13.0–17.0)
Hemoglobin: 9.3 g/dL — ABNORMAL LOW (ref 13.0–17.0)

## 2013-08-25 LAB — GLUCOSE, CAPILLARY
GLUCOSE-CAPILLARY: 149 mg/dL — AB (ref 70–99)
Glucose-Capillary: 70 mg/dL (ref 70–99)

## 2013-08-25 LAB — RETICULOCYTES
RBC.: 1.87 MIL/uL — ABNORMAL LOW (ref 4.22–5.81)
RETIC CT PCT: 3.5 % — AB (ref 0.4–3.1)
Retic Count, Absolute: 65.5 10*3/uL (ref 19.0–186.0)

## 2013-08-25 LAB — PROTIME-INR
INR: 1.16 (ref 0.00–1.49)
PROTHROMBIN TIME: 14.6 s (ref 11.6–15.2)

## 2013-08-25 LAB — ABO/RH: ABO/RH(D): O POS

## 2013-08-25 LAB — VITAMIN B12: VITAMIN B 12: 607 pg/mL (ref 211–911)

## 2013-08-25 LAB — FOLATE

## 2013-08-25 LAB — FERRITIN: Ferritin: 128 ng/mL (ref 22–322)

## 2013-08-25 LAB — PREPARE RBC (CROSSMATCH)

## 2013-08-25 MED ORDER — ALLOPURINOL 300 MG PO TABS
300.0000 mg | ORAL_TABLET | ORAL | Status: DC
  Administered 2013-08-27 – 2013-08-31 (×3): 300 mg via ORAL
  Filled 2013-08-25 (×3): qty 1

## 2013-08-25 MED ORDER — SODIUM CHLORIDE 0.9 % IJ SOLN: 3.0000 mL | INTRAMUSCULAR | Status: DC | PRN

## 2013-08-25 MED ORDER — INSULIN ASPART 100 UNIT/ML ~~LOC~~ SOLN: 0.0000 [IU] | Freq: Three times a day (TID) | SUBCUTANEOUS | Status: DC

## 2013-08-25 MED ORDER — ALBUTEROL SULFATE (2.5 MG/3ML) 0.083% IN NEBU: 2.5000 mg | INHALATION_SOLUTION | RESPIRATORY_TRACT | Status: DC | PRN

## 2013-08-25 MED ORDER — ADULT MULTIVITAMIN W/MINERALS CH
1.0000 | ORAL_TABLET | Freq: Every morning | ORAL | Status: DC
  Administered 2013-08-26 – 2013-09-01 (×7): 1 via ORAL
  Filled 2013-08-25 (×7): qty 1

## 2013-08-25 MED ORDER — ASPIRIN 81 MG PO CHEW
81.0000 mg | CHEWABLE_TABLET | Freq: Every morning | ORAL | Status: DC
  Administered 2013-08-26 – 2013-09-01 (×7): 81 mg via ORAL
  Filled 2013-08-25 (×7): qty 1

## 2013-08-25 MED ORDER — ATORVASTATIN CALCIUM 20 MG PO TABS
20.0000 mg | ORAL_TABLET | Freq: Every day | ORAL | Status: DC
Start: 2013-08-26 — End: 2013-09-01
  Administered 2013-08-26 – 2013-08-31 (×6): 20 mg via ORAL
  Filled 2013-08-25 (×7): qty 1

## 2013-08-25 MED ORDER — ACETAMINOPHEN 325 MG PO TABS: 650.0000 mg | ORAL_TABLET | Freq: Four times a day (QID) | ORAL | Status: DC | PRN

## 2013-08-25 MED ORDER — FUROSEMIDE 10 MG/ML IJ SOLN
40.0000 mg | Freq: Once | INTRAMUSCULAR | Status: AC
  Administered 2013-08-25: 40 mg via INTRAVENOUS
  Filled 2013-08-25: qty 4

## 2013-08-25 MED ORDER — SODIUM CHLORIDE 0.9 % IV SOLN
INTRAVENOUS | Status: DC
  Administered 2013-08-25: 20 mL/h via INTRAVENOUS

## 2013-08-25 MED ORDER — ATENOLOL 50 MG PO TABS
50.0000 mg | ORAL_TABLET | Freq: Every morning | ORAL | Status: DC
  Administered 2013-08-26 – 2013-09-01 (×7): 50 mg via ORAL
  Filled 2013-08-25 (×7): qty 1

## 2013-08-25 MED ORDER — ACETAMINOPHEN 650 MG RE SUPP: 650.0000 mg | Freq: Four times a day (QID) | RECTAL | Status: DC | PRN

## 2013-08-25 MED ORDER — ONDANSETRON HCL 4 MG/2ML IJ SOLN: 4.0000 mg | Freq: Four times a day (QID) | INTRAMUSCULAR | Status: DC | PRN

## 2013-08-25 MED ORDER — TAMSULOSIN HCL 0.4 MG PO CAPS
0.4000 mg | ORAL_CAPSULE | Freq: Every morning | ORAL | Status: DC
  Administered 2013-08-26 – 2013-09-01 (×7): 0.4 mg via ORAL
  Filled 2013-08-25 (×7): qty 1

## 2013-08-25 MED ORDER — SODIUM CHLORIDE 0.9 % IV SOLN: 250.0000 mL | INTRAVENOUS | Status: DC | PRN

## 2013-08-25 MED ORDER — ENOXAPARIN SODIUM 30 MG/0.3ML ~~LOC~~ SOLN
30.0000 mg | SUBCUTANEOUS | Status: DC
  Administered 2013-08-25 – 2013-08-31 (×7): 30 mg via SUBCUTANEOUS
  Filled 2013-08-25 (×8): qty 0.3

## 2013-08-25 MED ORDER — SODIUM CHLORIDE 0.9 % IJ SOLN
3.0000 mL | Freq: Two times a day (BID) | INTRAMUSCULAR | Status: DC
  Administered 2013-08-25 – 2013-08-26 (×2): 3 mL via INTRAVENOUS

## 2013-08-25 MED ORDER — ONDANSETRON HCL 4 MG PO TABS: 4.0000 mg | ORAL_TABLET | Freq: Four times a day (QID) | ORAL | Status: DC | PRN

## 2013-08-25 MED ORDER — SODIUM CHLORIDE 0.9 % IJ SOLN
3.0000 mL | Freq: Two times a day (BID) | INTRAMUSCULAR | Status: DC
  Administered 2013-08-25 – 2013-09-01 (×12): 3 mL via INTRAVENOUS

## 2013-08-25 NOTE — Progress Notes (Signed)
Received patient from ED,with  ongoing 2nd unit of PRBC infusing on  R hand, patient alert and oriented no complaints of any pain or discomfort, no adverse reaction noted during blood transfusion.

## 2013-08-25 NOTE — ED Provider Notes (Addendum)
CSN: [id number]     Arrival date & time 08/25/13  0915 History   First MD Initiated Contact with Patient 08/25/13 [phone]     Chief Complaint  Patient presents with  . Abnormal Lab  anemia, generalized weakness.   (Consider location/radiation/quality/duration/timing/severity/associated sxs/prior Treatment) The history is provided by the patient.  pt c/o being told by pcp on lab check yesterday that blood count very low.  Pt notes in past few weeks, generally felt weak, mild doe.  States he has to walk 1/8 mile from his residence to main area at [place], and w that walk he has to stop 1-2 x. Denies any abrupt or acute change in these symptoms in past day. Occasionally feels lightheaded when stands. No syncope. Hx anemia, pt states unsure of cause. Denies prior transfusion. Denies acute blood loss, x states possible recent mild nosebleed. No melena or hematochezia. Denies recent change in meds. No recent wt loss of wt gain.       Past Medical History  Diagnosis Date  . Diabetes mellitus   . Hypertension   . Pancreatitis   . CAD (coronary artery disease)   . Hyperlipemia   . Anemia     macrocytic   Past Surgical History  Procedure Laterality Date  . Appendectomy    . Cholecystectomy    . Angioplasty    . Ureteral stent placement      Left  . Orif humerus fracture  03/07/2012    Procedure: OPEN REDUCTION INTERNAL FIXATION (ORIF) PROXIMAL HUMERUS FRACTURE;  Surgeon: Augustin Schooling, MD;  Location: [facility];  Service: Orthopedics;  Laterality: Right;   No family history on file. History  Substance Use Topics  . Smoking status: Never Smoker   . Smokeless tobacco: Not on file  . Alcohol Use: No    Review of Systems  Constitutional: Negative for fever.  HENT: Negative for sore throat.   Eyes: Negative for redness.  Respiratory: Negative for shortness of breath.   Cardiovascular: Negative for chest pain.  Gastrointestinal: Negative for vomiting, abdominal pain, diarrhea and blood in  stool.  Genitourinary: Negative for flank pain.  Musculoskeletal: Negative for back pain and neck pain.  Skin: Negative for rash.  Neurological: Negative for headaches.  Hematological: Does not bruise/bleed easily.  Psychiatric/Behavioral: Negative for confusion.    Allergies  Review of patient's allergies indicates no known allergies.  Home Medications   Current Outpatient Rx  Name  Route  Sig  Dispense  Refill  . allopurinol (ZYLOPRIM) 300 MG tablet   Oral   Take 300 mg by mouth every other day.          Marland Kitchen aspirin 81 MG tablet   Oral   Take 81 mg by mouth daily.         Marland Kitchen atenolol (TENORMIN) 50 MG tablet   Oral   Take 50 mg by mouth daily.           Marland Kitchen glimepiride (AMARYL) 2 MG tablet   Oral   Take 2 mg by mouth daily before breakfast.           . Multiple Vitamin (MULTIVITAMIN) tablet   Oral   Take 1 tablet by mouth daily.         . rosuvastatin (CRESTOR) 10 MG tablet   Oral   Take 10 mg by mouth every other day.           BP 101/42  Pulse 66  Temp(Src) 94.5 F (34.7 C) (Oral)  Resp 22  SpO2 86% Physical Exam  Nursing note and vitals reviewed. Constitutional: He is oriented to person, place, and time. He appears well-developed and well-nourished. No distress.  HENT:  Head: Atraumatic.  Eyes: Pupils are equal, round, and reactive to light.  Conj mildly pale.   Neck: Neck supple. No tracheal deviation present.  Cardiovascular: Normal rate, regular rhythm, normal heart sounds and intact distal pulses.   Pulmonary/Chest: Effort normal and breath sounds normal. No accessory muscle usage. No respiratory distress.  Abdominal: Soft. Bowel sounds are normal. He exhibits no distension and no mass. There is no tenderness. There is no rebound and no guarding.  Genitourinary:  Light brown stool.  No melena.   Musculoskeletal: Normal range of motion. He exhibits no edema and no tenderness.  Neurological: He is alert and oriented to person, place, and time.   Skin: Skin is warm and dry. He is not diaphoretic. There is pallor.  Psychiatric: He has a normal mood and affect.    ED Course  Procedures (including critical care time)   Results for orders placed during the hospital encounter of 08/25/13  COMPREHENSIVE METABOLIC PANEL      Result Value Range   Sodium 134 (*) 137 - 147 mEq/L   Potassium 4.5  3.7 - 5.3 mEq/L   Chloride 102  96 - 112 mEq/L   CO2 17 (*) 19 - 32 mEq/L   Glucose, Bld 141 (*) 70 - 99 mg/dL   BUN 49 (*) 6 - 23 mg/dL   Creatinine, Ser 3.14 (*) 0.50 - 1.35 mg/dL   Calcium 8.4  8.4 - 10.5 mg/dL   Total Protein 7.6  6.0 - 8.3 g/dL   Albumin 2.9 (*) 3.5 - 5.2 g/dL   AST 14  0 - 37 U/L   ALT 15  0 - 53 U/L   Alkaline Phosphatase 68  39 - 117 U/L   Total Bilirubin 0.3  0.3 - 1.2 mg/dL   GFR calc non Af Amer 16 (*) >90 mL/min   GFR calc Af Amer 19 (*) >90 mL/min  PROTIME-INR      Result Value Range   Prothrombin Time 14.6  11.6 - 15.2 seconds   INR 1.16  0.00 - 1.49  TYPE AND SCREEN      Result Value Range   ABO/RH(D) O POS     Antibody Screen NEG     Sample Expiration 08/28/2013     Unit Number [id number]     Blood Component Type RED CELLS,LR     Unit division 00     Status of Unit ALLOCATED     Transfusion Status OK TO TRANSFUSE     Crossmatch Result Compatible     Unit Number [id number]     Blood Component Type RBC LR PHER1     Unit division 00     Status of Unit ALLOCATED     Transfusion Status OK TO TRANSFUSE     Crossmatch Result Compatible    PREPARE RBC (CROSSMATCH)      Result Value Range   Order Confirmation ORDER PROCESSED BY BLOOD BANK    ABO/RH      Result Value Range   ABO/RH(D) O POS     Lab reports hgb 3.5, full results pending.    EKG Interpretation    Date/Time:  Wednesday August 25 2013 13:51:38 EST Ventricular Rate:  63 PR Interval:  242 QRS Duration: 125 QT Interval:  472 QTC Calculation: 483 R Axis:  57 Text Interpretation:  Sinus rhythm Prolonged PR interval Right  bundle branch block Confirmed by Mohd Clemons  MD, Analiya Porco ([id number]) on 08/25/2013 1:54:49 PM            MDM  Iv ns. Labs.  Reviewed nursing notes and prior charts for additional history.   Hx macrocytic anemia - per heme/onc note, ?MDS vs MPD.  Lab reports hgb 3.5, pt notes 6 from pcp.  Transfuse prbc.   Med service called for admission.      Mirna Mires, MD 08/25/13 [place], MD 08/25/13 [phone]

## 2013-08-25 NOTE — Progress Notes (Signed)
Blood transfusion done  PRBC unit #[id number], no adverse reaction noted.

## 2013-08-25 NOTE — ED Notes (Signed)
Pt went to MD.  Told hbg 6.1.  Went to MD to discuss multiple complaints including shortness of breath.  No pain.  Sats high 80's in triage per verbal

## 2013-08-25 NOTE — H&P (Signed)
[facility]  History and Physical  Alma Muegge Bromell YBO:[id number] DOB: 27-Dec-1923 DOA: 08/25/2013  Referring physician: EDP PCP: Criselda Peaches, MD  Outpatient Specialists:  1. Oncology: Dr. Zola Button  Chief Complaint: Generalized weakness   HPI: [name] Flynn is a 80 y.o. male , resident of [place], PMH of DM 2 with renal complications, HTN, CAD, hyperlipidemia, stage IV chronic kidney disease, left perihilar renal mass followed by oncology, RBBB, was referred by his PCPs office for evaluation and management of anemia. Patient presented with generalized weakness. Patient states that he first noticed gradual onset of weakness approximately 6 months ago when he noticed easy tiring while walking the same distance as before and he had to take a couple of rest stops. These symptoms have progressively gotten worse. Now even walking a short distance and sometimes at rest he feels easily tired and short of breath. He denies cough, chest pain, orthopnea or PND. He denies leg swelling. Will also has intermittent dizziness and lightheadedness with activity but has not passed out or fallen. He denies bleeding, vomiting or melena. Last colonoscopy greater than 5 years ago was said to be normal and GI M.D. advised him that he didn't need any more screening colonoscopy due to his advanced age. He went for a routine visit to his PCPs office yesterday and was called back today to advise him that his hemoglobin was 6.1 g and he had to come to the ED. In the ED, hemoglobin 6.4 creatinine 3.14 MCV 104.3. EDP is started transfusing with 2 units of PRBCs. Hospitalist admission requested.    Review of Systems: All systems reviewed and apart from history of presenting illness, are negative.  Past Medical History  Diagnosis Date  . Diabetes mellitus   . Hypertension   . Pancreatitis   . CAD (coronary artery disease)   . Hyperlipemia   . Anemia     macrocytic  . DM type 2 causing renal  disease    Past Surgical History  Procedure Laterality Date  . Appendectomy    . Cholecystectomy    . Angioplasty    . Ureteral stent placement      Left  . Orif humerus fracture  03/07/2012    Procedure: OPEN REDUCTION INTERNAL FIXATION (ORIF) PROXIMAL HUMERUS FRACTURE;  Surgeon: Augustin Schooling, MD;  Location: [facility];  Service: Orthopedics;  Laterality: Right;   Social History:  reports that he has never smoked. He does not have any smokeless tobacco history on file. He reports that he does not drink alcohol or use illicit drugs.  widowed. Ambulates with the help of a walker.   No Known Allergies  History reviewed. No pertinent family history.  negative family history.  Prior to Admission medications   Medication Sig Start Date End Date Taking? Authorizing Provider  allopurinol (ZYLOPRIM) 300 MG tablet Take 300 mg by mouth every other day.    Yes Historical Provider, MD  aspirin 81 MG tablet Take 81 mg by mouth every morning.    Yes Historical Provider, MD  atenolol (TENORMIN) 50 MG tablet Take 50 mg by mouth every morning.    Yes Historical Provider, MD  glimepiride (AMARYL) 2 MG tablet Take 2 mg by mouth daily before breakfast.     Yes Historical Provider, MD  Multiple Vitamin (MULTIVITAMIN) tablet Take 1 tablet by mouth every morning.    Yes Historical Provider, MD  rosuvastatin (CRESTOR) 10 MG tablet Take 10 mg by mouth every other day.  Yes Historical Provider, MD  tamsulosin (FLOMAX) 0.4 MG CAPS capsule Take 0.4 mg by mouth every morning.   Yes Historical Provider, MD   Physical Exam: Filed Vitals:   08/25/13 0942 08/25/13 1145 08/25/13 1200 08/25/13 1215  BP:   166/80 153/67  Pulse:   75   Temp: 97.7 F (36.5 C) 98.5 F (36.9 C)  98 F (36.7 C)  TempSrc: Rectal Oral    Resp:   16   SpO2:   98%      General exam: Moderately built and nourished  pleasant elderly male patient, lying comfortably supine on the gurney in no obvious distress.  Head, eyes and ENT:  Nontraumatic and normocephalic. Pupils equally reacting to light and accommodation. Oral mucosa moist but pale .  Neck: Supple. No JVD, carotid bruit or thyromegaly.  Lymphatics: No lymphadenopathy.  Respiratory system: Clear to auscultation. No increased work of breathing.  Cardiovascular system: S1 and S2 heard, RRR. No JVD, murmurs, gallops, clicks or pedal edema.  Gastrointestinal system: Abdomen is nondistended, soft and nontender. Normal bowel sounds heard. No organomegaly or masses appreciated.  Central nervous system: Alert and oriented. No focal neurological deficits.  Extremities: Symmetric 5 x 5 power. Peripheral pulses symmetrically felt.   Skin: No rashes or acute findings.  Musculoskeletal system: Negative exam.  Psychiatry: Pleasant and cooperative.   Labs on Admission:  Basic Metabolic Panel:  Recent Labs Lab 08/25/13 1005  NA 134*  K 4.5  CL 102  CO2 17*  GLUCOSE 141*  BUN 49*  CREATININE 3.14*  CALCIUM 8.4   Liver Function Tests:  Recent Labs Lab 08/25/13 1005  AST 14  ALT 15  ALKPHOS 68  BILITOT 0.3  PROT 7.6  ALBUMIN 2.9*   No results found for this basename: LIPASE, AMYLASE,  in the last 168 hours No results found for this basename: AMMONIA,  in the last 168 hours CBC:  Recent Labs Lab 08/25/13 1130  WBC 8.7  HGB 6.4*  6.4*  HCT 19.6*  19.5*  MCV 104.3*  PLT 207   Cardiac Enzymes: No results found for this basename: CKTOTAL, CKMB, CKMBINDEX, TROPONINI,  in the last 168 hours  BNP (last 3 results) No results found for this basename: PROBNP,  in the last 8760 hours CBG: No results found for this basename: GLUCAP,  in the last 168 hours  Radiological Exams on Admission: No results found.   Assessment/Plan Principal Problem:   Anemia Active Problems:   Hypertension   CAD (coronary artery disease)   Hyperlipemia   DM type 2 causing renal disease   1. 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 Admit to telemetry. Followup anemia panel. Rectal exam by EDP showed brown stools and was FOBT negative. Agree with transfusing 2 units of PRBCs. Follow posttransfusion CBC. Dyspnea likely related to anemia but will check a chest x-ray. OP follow up with oncology 2. Stage IV chronic kidney disease: Creatinine seems to be at baseline. Patient declines hemodialysis. No urgent indication for dialysis. Consider outpatient nephrology consultation and followup. 3. Type II DM with renal complications: Hold oral hypoglycemics while hospitalized. Place on SSI. 4. Hypertension: Continue atenolol. 5. Left kidney perihilar mass: Outpatient followup with oncology. 6. CAD: Asymptomatic of chest pain. Check EKG.     Code Status:  DO NOT RESUSCITATE  Family Communication:  none at bedside  Disposition Plan:  return to nursing facility when  medically stable   Time spent:  40 minutes  Traven Davids, MD, FACP, FHM. [facility] Pager [phone]  If 7PM-7AM, please contact night-coverage [email or web address] Password [facility] 08/25/2013, 12:59 PM

## 2013-08-25 NOTE — Progress Notes (Signed)
UR completed 

## 2013-08-25 NOTE — ED Notes (Signed)
Pt CBG. [place]

## 2013-08-25 NOTE — Progress Notes (Signed)
Clinical Social Work Department BRIEF PSYCHOSOCIAL ASSESSMENT 08/25/2013  Patient:  [name] Flynn, [name] Flynn     Account Number:  [id number]     Admit date:  08/25/2013  Clinical Social Worker:  [employer]  Date/Time:  08/25/2013 02:01 PM  Referred by:  RN  Date Referred:  08/25/2013 Referred for  SNF Placement   Other Referral:   Interview type:  Patient Other interview type:    PSYCHOSOCIAL DATA Living Status:  FACILITY Admitted from facility:  [facility] Level of care:  Independent Living Primary support name:  Arts administrator Primary support relationship to patient:  SIBLING Degree of support available:   strong, lives at facility as well    CURRENT CONCERNS Current Concerns  Post-Acute Placement   Other Concerns:    SOCIAL WORK ASSESSMENT / PLAN CSW met wiht pt at bedside to complete psychosocial assessmnet. Pt shares he is from independent living at [facility] and plans to return when medically stable. Patient states he is willing to go to the health care center if needed however hopes to return to his apartment.   Assessment/plan status:  Psychosocial Support/Ongoing Assessment of Needs Other assessment/ plan:   Information/referral to community resources:   none identified at this time.    PATIENT'S/FAMILY'S RESPONSE TO PLAN OF CARE: Patient thanked csw for concern and support. Paitent hopes to return to independent living when medically stable. CSW confirmed with facility that patient is indepedent living. CSW to follow to see if patient needs higher level of care pending pt/ot evaluation.       Dorathy Kinsman, LCSW [phone]  ED CSW .08/25/2013 1406pm

## 2013-08-26 DIAGNOSIS — E162 Hypoglycemia, unspecified: Secondary | ICD-10-CM

## 2013-08-26 DIAGNOSIS — I509 Heart failure, unspecified: Secondary | ICD-10-CM

## 2013-08-26 LAB — TYPE AND SCREEN
ABO/RH(D): O POS
ANTIBODY SCREEN: NEGATIVE
UNIT DIVISION: 0
Unit division: 0

## 2013-08-26 LAB — BASIC METABOLIC PANEL
BUN: 48 mg/dL — ABNORMAL HIGH (ref 6–23)
CO2: 19 meq/L (ref 19–32)
Calcium: 9 mg/dL (ref 8.4–10.5)
Chloride: 103 mEq/L (ref 96–112)
Creatinine, Ser: 3.15 mg/dL — ABNORMAL HIGH (ref 0.50–1.35)
GFR calc Af Amer: 19 mL/min — ABNORMAL LOW (ref >90)
GFR calc non Af Amer: 16 mL/min — ABNORMAL LOW (ref >90)
GLUCOSE: 83 mg/dL (ref 70–99)
POTASSIUM: 4.2 meq/L (ref 3.7–5.3)
SODIUM: 139 meq/L (ref 137–147)

## 2013-08-26 LAB — CBC
HCT: 31 % — ABNORMAL LOW (ref 39.0–52.0)
HEMOGLOBIN: 10.1 g/dL — AB (ref 13.0–17.0)
MCH: 32.6 pg (ref 26.0–34.0)
MCHC: 32.6 g/dL (ref 30.0–36.0)
MCV: 100 fL (ref 78.0–100.0)
Platelets: 237 10*3/uL (ref 150–400)
RBC: 3.1 MIL/uL — ABNORMAL LOW (ref 4.22–5.81)
RDW: 19.3 % — ABNORMAL HIGH (ref 11.5–15.5)
WBC: 11.3 10*3/uL — AB (ref 4.0–10.5)

## 2013-08-26 LAB — GLUCOSE, CAPILLARY
GLUCOSE-CAPILLARY: 82 mg/dL (ref 70–99)
GLUCOSE-CAPILLARY: 46 mg/dL — AB (ref 70–99)
GLUCOSE-CAPILLARY: 68 mg/dL — AB (ref 70–99)
GLUCOSE-CAPILLARY: 116 mg/dL — AB (ref 70–99)
GLUCOSE-CAPILLARY: 104 mg/dL — AB (ref 70–99)
GLUCOSE-CAPILLARY: 58 mg/dL — AB (ref 70–99)
Glucose-Capillary: 69 mg/dL — ABNORMAL LOW (ref 70–99)
Glucose-Capillary: 50 mg/dL — ABNORMAL LOW (ref 70–99)
Glucose-Capillary: 52 mg/dL — ABNORMAL LOW (ref 70–99)
Glucose-Capillary: 64 mg/dL — ABNORMAL LOW (ref 70–99)
Glucose-Capillary: 92 mg/dL (ref 70–99)
Glucose-Capillary: 59 mg/dL — ABNORMAL LOW (ref 70–99)
Glucose-Capillary: 79 mg/dL (ref 70–99)
Glucose-Capillary: 84 mg/dL (ref 70–99)

## 2013-08-26 LAB — MRSA PCR SCREENING: MRSA BY PCR: NEGATIVE

## 2013-08-26 MED ORDER — DEXTROSE 50 % IV SOLN
25.0000 mL | Freq: Once | INTRAVENOUS | Status: AC | PRN
  Administered 2013-08-26: 14:00:00 25 mL via INTRAVENOUS

## 2013-08-26 MED ORDER — DEXTROSE 50 % IV SOLN
INTRAVENOUS | Status: AC
  Administered 2013-08-26: 16:00:00 25 mL
  Filled 2013-08-26: qty 50

## 2013-08-26 MED ORDER — FUROSEMIDE 10 MG/ML IJ SOLN
60.0000 mg | Freq: Every day | INTRAMUSCULAR | Status: DC
  Administered 2013-08-26: 60 mg via INTRAVENOUS
  Filled 2013-08-26 (×2): qty 6

## 2013-08-26 MED ORDER — DEXTROSE 5 % IV SOLN
INTRAVENOUS | Status: DC
  Administered 2013-08-26: 14:00:00 via INTRAVENOUS

## 2013-08-26 MED ORDER — DEXTROSE 50 % IV SOLN
INTRAVENOUS | Status: AC
  Filled 2013-08-26: qty 50

## 2013-08-26 MED ORDER — DEXTROSE 50 % IV SOLN
50.0000 mL | Freq: Once | INTRAVENOUS | Status: AC | PRN
  Administered 2013-08-26: 50 mL via INTRAVENOUS

## 2013-08-26 NOTE — Evaluation (Signed)
Occupational Therapy Evaluation Patient Details Name: [name] Flynn MRN: [id number] DOB: 03-03-1924 Today's Date: 08/26/2013 Time: 1000-1017 OT Time Calculation (min): 17 min  OT Assessment / Plan / Recommendation History of present illness 80 yo male admitted with anemia, weakness. Hx of DM, HTN. Pt is from [place].    Clinical Impression   Pt is a very independent man at baseline.  He is functioning at a supervision level in ADL and mobility with a RW.  Pt has a hx of falls.  He reports having difficulty walking to the dining room of his independent living facility for the last 2 years with his rollator and could benefit from a motorized w/c or scooter for long distances.  Plan to address energy conservation, ADL transfers, and ability to gather items for ADL with walker while managing 02 tubing safely in the case that pt is discharged with oxygen.    OT Assessment  Patient needs continued OT Services    Follow Up Recommendations  Home health OT    Barriers to Discharge      Equipment Recommendations  Wheelchair (measurements OT);Wheelchair cushion (measurements OT) (pt needs a motorized w/c )    Recommendations for Other Services    Frequency  Min 2X/week    Precautions / Restrictions Precautions Precautions: Fall Restrictions Weight Bearing Restrictions: No   Pertinent Vitals/Pain No pain, on 2 L 02 throughout    ADL  Eating/Feeding: Independent Where Assessed - Eating/Feeding: Chair Grooming: Wash/dry hands;Brushing hair;Supervision/safety Where Assessed - Grooming: Unsupported standing Upper Body Bathing: Set up Where Assessed - Upper Body Bathing: Unsupported sitting Lower Body Bathing: Supervision/safety Where Assessed - Lower Body Bathing: Supported sit to stand Upper Body Dressing: Set up Where Assessed - Upper Body Dressing: Unsupported sitting Lower Body Dressing: Supervision/safety Where Assessed - Lower Body Dressing: Supported sit to  Lobbyist: Supervision/safety Armed forces technical officer Method: Sit to Loss adjuster, chartered: Comfort height toilet;Grab bars Toileting - Water quality scientist and Hygiene: Supervision/safety Where Assessed - Best boy and Hygiene: Standing Equipment Used: Rolling walker (2L 02) Transfers/Ambulation Related to ADLs: supervision with RW mostly for 02 tubing ADL Comments: Pt with poor endurance for last year to year and a half.  Difficulty getting from his apartment to the dining room of the facility.  Pt still drives.  He does not use his rollator in his home, only for walking to dining room.  Pt with hx of falls, was participating in a group balance program [facility].    OT Diagnosis: Generalized weakness  OT Problem List: Decreased activity tolerance;Impaired balance (sitting and/or standing);Decreased knowledge of use of DME or AE OT Treatment Interventions: Self-care/ADL training;Energy conservation;Patient/family education;Balance training   OT Goals(Current goals can be found in the care plan section) Acute Rehab OT Goals Patient Stated Goal: home OT Goal Formulation: With patient Time For Goal Achievement: 09/02/13 Potential to Achieve Goals: Good ADL Goals Pt Will Perform Grooming: with modified independence;standing (3 activities) Pt Will Transfer to Toilet: with modified independence;ambulating Pt Will Perform Toileting - Clothing Manipulation and hygiene: with modified independence;sit to/from stand Pt Will Perform Tub/Shower Transfer: with modified independence;ambulating;shower seat;grab bars Additional ADL Goal #1: Pt will generalize breathing techniques and energy conservation strategies in ADL. Additional ADL Goal #2: Pt will gather items around room necessary for ADL while managing 02 tubing in the event 02 is ordered for home use.  Visit Information  Last OT Received On: 08/26/13 Assistance Needed: +1 History of Present Illness:  80 yo male admitted with anemia, weakness. Hx of DM, HTN. Pt is from [place].        Prior [place] expects to be discharged to:: Private residence Living Arrangements: Alone Type of Home: Independent living facility Home Access: Level entry Home Layout: One [place]: Shower seat - built in;Walker - 4 wheels;Cane - single point;Grab bars - toilet;Grab bars - tub/shower;Hand held shower head Prior Function Level of Independence: Independent with assistive device(s) Communication Communication: No difficulties Dominant Hand: Right         Vision/Perception Vision - History Baseline Vision: Wears glasses all the time Patient Visual Report: Blurring of vision   Cognition  Cognition Arousal/Alertness: Awake/alert Behavior During Therapy: WFL for tasks assessed/performed Overall Cognitive Status: Within Functional Limits for tasks assessed    Extremity/Trunk Assessment Upper Extremity Assessment Upper Extremity Assessment: Overall WFL for tasks assessed Lower Extremity Assessment Lower Extremity Assessment: Defer to PT evaluation Cervical / Trunk Assessment Cervical / Trunk Assessment: Normal     Mobility Bed Mobility General bed mobility comments: NT-pt is in recliner Transfers Overall transfer level: Needs assistance Equipment used: Rolling walker (2 wheeled) Transfers: Sit to/from Stand Sit to Stand: Supervision General transfer comment: VCSs hand placemenet     Exercise     Balance     End of Session OT - End of Session Activity Tolerance: Patient tolerated treatment well Patient left: in chair;with call bell/phone within reach  GO     Malka So 08/26/2013, 11:15 AM [phone]

## 2013-08-26 NOTE — Progress Notes (Signed)
Hypoglycemic Event  CBG: 68 at 1507  Treatment: 4oz orange juice and graham crackers  Symptoms: pt reports that he is "sleepy"  Follow-up CBG: Time: 1534 CBG Result: 59  Possible Reasons for Event: medication  Comments/MD notified: Pt given 37ml of D50 at 1546 for CBG of 59 after carbohydrates given. CBG rechecked at 1604 and found to be 116. Will continue D5 IVF at [facility] per MD orders and will recheck CBG in 1 hour, which will be prior to dinner. Will continue to monitor pt and treat hypoglycemia.     Othella Boyer [facility]  Remember to initiate Hypoglycemia Order Set & complete

## 2013-08-26 NOTE — Evaluation (Addendum)
Physical Therapy Evaluation Patient Details Name: [name] Flynn MRN: [id number] DOB: [birth year] Today's Date: 08/26/2013 Time: 9892-1194 PT Time Calculation (min): 35 min  SATURATION QUALIFICATIONS: (This note is used to comply with regulatory documentation for home oxygen)  Patient Saturations on Room Air at Rest = 95%  Patient Saturations on Room Air while Ambulating = 77%  Patient Saturations on 2 Liters of oxygen while Ambulating = 85%  Please briefly explain why patient needs home oxygen: to maintain safe O2 saturation levels when mobilizing/ambulating    PT Assessment / Plan / Recommendation History of Present Illness  80 yo male admitted with anemia, weakness. Hx of DM, HTN. Pt is from [place].   Clinical Impression  On eval, pt required supervision level assist for mobility-able to ambulate ~250 feet with walker. Difficulty getting consistent O2 reading so unsure of accuracy of readings documented in this note. If pt needs O2, he may be able to d/c back to [facility] if he is issued a small tank that he can carry on his shoulder. He will not be able to manage walker and large tank.     PT Assessment  Patient needs continued PT services    Follow Up Recommendations  HHPT. (however pt will not be able to manage safely with large O2 tank and walker-will need small O2 tank with carrying case if O2 is needed). If he cannot manage/mobilize safely he may need short stay in rehab.     Does the patient have the potential to tolerate intense rehabilitation      Barriers to Discharge        Equipment Recommendations  None recommended by PT    Recommendations for Other Services OT consult   Frequency Min 3X/week    Precautions / Restrictions Precautions Precautions: Fall Restrictions Weight Bearing Restrictions: No   Pertinent Vitals/Pain No pain      Mobility  Bed Mobility General bed mobility comments: NT-pt is in recliner Transfers Overall  transfer level: Needs assistance Transfers: Sit to/from Stand Sit to Stand: Supervision General transfer comment: VCSs hand placemenet Ambulation/Gait Ambulation/Gait assistance: Supervision Ambulation Distance (Feet): 250 Feet (250'x1, 150x1) Assistive device: Rolling walker (2 wheeled) Gait Pattern/deviations: Step-through pattern General Gait Details: slow gait speed. Dyspnea 2/4 with ambulation. Very difficult to get consistent reading on O2 monitor so unsure of accuracy.     Exercises     PT Diagnosis: Difficulty walking  PT Problem List: Decreased activity tolerance;Decreased mobility PT Treatment Interventions: Gait training;Functional mobility training;Therapeutic activities;Therapeutic exercise;Patient/family education     PT Goals(Current goals can be found in the care plan section) Acute Rehab PT Goals Patient Stated Goal: home PT Goal Formulation: With patient Time For Goal Achievement: 09/09/13 Potential to Achieve Goals: Good  Visit Information  Last PT Received On: 08/26/13 Assistance Needed: +1 History of Present Illness: 80 yo male admitted with anemia, weakness. Hx of DM, HTN. Pt is from [place].        Prior [place] expects to be discharged to:: Private residence Type of Home: Independent living facility Home Access: Level entry Home Layout: One level Home Equipment: Shower seat - built in;Walker - 4 wheels;Cane - single point Prior Function Level of Independence: Independent with assistive device(s) Communication Communication: No difficulties    Cognition  Cognition Arousal/Alertness: Awake/alert Behavior During Therapy: WFL for tasks assessed/performed Overall Cognitive Status: Within Functional Limits for tasks assessed    Extremity/Trunk Assessment Upper Extremity Assessment Upper Extremity Assessment:  Defer to OT evaluation Lower Extremity Assessment Lower Extremity Assessment: Generalized  weakness Cervical / Trunk Assessment Cervical / Trunk Assessment: Normal   Balance    End of Session PT - End of Session Equipment Utilized During Treatment: Oxygen Activity Tolerance: Patient tolerated treatment well Patient left: in chair;with call bell/phone within reach  GP     [name], MPT Pager: [phone]

## 2013-08-26 NOTE — Progress Notes (Addendum)
Hypoglycemic Event  CBG:69 at 1210  Treatment: 4oz of orange juice  Symptoms: None  Follow-up CBG: Time:1225 CBG Result: 64  Possible Reasons for Event:   Comments/MD notified: Pt has lunch tray at this time and is eating. Will recheck CBG after pt finishes eating.   Rechecked CBG at 1302 after pt had finished eating and CBG was 50. Gave pt another 4oz of orange juice and rechecked at 1336 and CBG was 46. At this time MD made aware and verbal order received to continue using the hypoglycemia protocol and to start D5 IVF at [facility], and monitor blood glucose closely. At 1340 pt was given 72ml of D50 via IV and started on D5 at [facility] per MD orders. Rechecked at 1359 and CBG was 92. Will recheck CBG in 1 hour to be sure that it is not low again. Will continue to monitor pt.   Othella Boyer [facility]  Remember to initiate Hypoglycemia Order Set & complete

## 2013-08-26 NOTE — Progress Notes (Signed)
[facility] PROGRESS NOTE    ALEXI GEIBEL ENI:[id number] DOB: [birth year] DOA: 08/25/2013 PCP: Criselda Peaches, MD  HPI/Brief narrative 80 y.o. male , resident of [facility], PMH of DM 2 with renal complications, HTN, CAD, hyperlipidemia, stage IV chronic kidney disease, left perihilar renal mass followed by oncology, RBBB, was referred by his PCPs office for evaluation and management of anemia. Patient presented with generalized weakness   Assessment/Plan:  1. 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 Admit to telemetry. Followup anemia panel. Rectal exam by EDP showed brown stools and was FOBT negative. Improved s/p 2 PRBC's Dyspnea likely related to anemia but will check a chest x-ray. OP follow up with oncology 2. Acute on ? Chronic CHF(Unknown type): check 2 D Echo. Continue IV Lasix. 3. Stage IV chronic kidney disease: Creatinine seems to be at baseline. Patient declines hemodialysis. No urgent indication for dialysis. Consider outpatient nephrology consultation and followup. 4. Type II DM with renal complications/Hypoglycemia: Likely due to poor excretion of prior PO meds and worsening renal failure. DC oral hypoglycemics. DC SSI.Hypoglycemia protocol. Monitor closely and may need D5/10 if persists. 5. Hypertension: Continue atenolol. 6. Left kidney perihilar mass: Outpatient followup with oncology. 7. CAD: Asymptomatic of chest pain.    Code Status: DNR Family Communication: None Disposition Plan: NH when stable   Consultants:  None  Procedures:  None  Antibiotics:  None   Subjective: Some dyspnea  Objective: Filed Vitals:   08/25/13 1815 08/25/13 1845 08/25/13 2233 08/26/13 0654  BP: 178/93 170/93 159/70 146/73  Pulse: 74 75 69 84  Temp: 98.1 F (36.7 C) 98.2 F (36.8 C) 98.6 F (37 C) 98.8 F (37.1 C)   TempSrc: Oral Oral Oral Oral  Resp: 20 20 20 20   Height:      Weight:    70.7 kg (155 lb 13.8 oz)  SpO2: 98% 98% 100% 94%    Intake/Output Summary (Last 24 hours) at 08/26/13 0728 Last data filed at 08/26/13 0658  Gross per 24 hour  Intake 1424.5 ml  Output   1150 ml  Net  274.5 ml   Filed Weights   08/25/13 1802 08/26/13 0654  Weight: 74.2 kg (163 lb 9.3 oz) 70.7 kg (155 lb 13.8 oz)     Exam:  General exam: Sitting up in chair with mild SOB Respiratory system: Basal crackles. Mild increased work of breathing. Cardiovascular system: S1 & S2 heard, RRR. No JVD, murmurs, gallops, clicks or pedal edema. Tele: SR Gastrointestinal system: Abdomen is nondistended, soft and nontender. Normal bowel sounds heard. Central nervous system: Alert and oriented. No focal neurological deficits. Extremities: Symmetric 5 x 5 power.   Data Reviewed: Basic Metabolic Panel:  Recent Labs Lab 08/25/13 1005 08/26/13 0447  NA 134* 139  K 4.5 4.2  CL 102 103  CO2 17* 19  GLUCOSE 141* 83  BUN 49* 48*  CREATININE 3.14* 3.15*  CALCIUM 8.4 9.0   Liver Function Tests:  Recent Labs Lab 08/25/13 1005  AST 14  ALT 15  ALKPHOS 68  BILITOT 0.3  PROT 7.6  ALBUMIN 2.9*   No results found for this basename: LIPASE, AMYLASE,  in the last 168 hours No results found for this basename: AMMONIA,  in the last 168 hours CBC:  Recent Labs Lab 08/25/13 1130 08/25/13 2103 08/26/13 0447  WBC 8.7  --  11.3*  HGB 6.4*  6.4* 9.3*  10.1*  HCT 19.6*  19.5* 28.3* 31.0*  MCV 104.3*  --  100.0  PLT 207  --  237   Cardiac Enzymes: No results found for this basename: CKTOTAL, CKMB, CKMBINDEX, TROPONINI,  in the last 168 hours BNP (last 3 results) No results found for this basename: PROBNP,  in the last 8760 hours CBG:  Recent Labs Lab 08/25/13 1705 08/25/13 2234  GLUCAP 70 149*    No results found for this or any previous visit (from the past 240 hour(s)).    Studies: Dg Chest 2  View  08/25/2013   CLINICAL DATA:  Short of breath  EXAM: CHEST  2 VIEW  COMPARISON:  CT CHEST W/O CM dated 12/01/2012; DG CHEST 2 VIEW dated 03/07/2012  FINDINGS: Cardiac silhouette is enlarged but unchanged. There is new patchy bilateral airspace disease. This is superimposed on chronic interstitial lung disease. These findings appear progressed compared to prior CT and chest radiograph. No pleural fluid.  IMPRESSION: 1. Interval increase in bilateral patchy airspace disease suggests pulmonary edema or less likely multifocal pneumonia superimposed on chronic interstitial lung disease. Recommend followup radiographs to ensure resolution and exclude a malignant nodularity. 2. Cardiomegaly is similar prior.   Electronically Signed   By: Suzy Bouchard M.D.   On: 08/25/2013 14:02        Scheduled Meds: . [START ON 08/27/2013] allopurinol  300 mg Oral QODAY  . aspirin  81 mg Oral q morning - 10a  . atenolol  50 mg Oral q morning - 10a  . atorvastatin  20 mg Oral q1800  . enoxaparin (LOVENOX) injection  30 mg Subcutaneous Q24H  . insulin aspart  0-9 Units Subcutaneous TID WC  . multivitamin with minerals  1 tablet Oral q morning - 10a  . sodium chloride  3 mL Intravenous Q12H  . sodium chloride  3 mL Intravenous Q12H  . tamsulosin  0.4 mg Oral q morning - 10a   Continuous Infusions:   Principal Problem:   Anemia Active Problems:   Hypertension   CAD (coronary artery disease)   Hyperlipemia   DM type 2 causing renal disease   Chronic kidney disease, stage IV (severe)    Time spent: 45 minutes.    Vernell Leep, MD, FACP, FHM. [facility] Pager [phone]  If 7PM-7AM, please contact night-coverage [email or web address] Password [email or web address] 08/26/2013, 7:28 AM    LOS: 1 day

## 2013-08-27 ENCOUNTER — Inpatient Hospital Stay (HOSPITAL_COMMUNITY): Payer: Medicare Other

## 2013-08-27 DIAGNOSIS — J9601 Acute respiratory failure with hypoxia: Secondary | ICD-10-CM | POA: Diagnosis present

## 2013-08-27 DIAGNOSIS — J9600 Acute respiratory failure, unspecified whether with hypoxia or hypercapnia: Secondary | ICD-10-CM

## 2013-08-27 DIAGNOSIS — E11649 Type 2 diabetes mellitus with hypoglycemia without coma: Secondary | ICD-10-CM | POA: Diagnosis not present

## 2013-08-27 DIAGNOSIS — E1169 Type 2 diabetes mellitus with other specified complication: Secondary | ICD-10-CM

## 2013-08-27 DIAGNOSIS — I5033 Acute on chronic diastolic (congestive) heart failure: Secondary | ICD-10-CM

## 2013-08-27 DIAGNOSIS — I359 Nonrheumatic aortic valve disorder, unspecified: Secondary | ICD-10-CM

## 2013-08-27 LAB — GLUCOSE, CAPILLARY
GLUCOSE-CAPILLARY: 39 mg/dL — AB (ref 70–99)
GLUCOSE-CAPILLARY: 79 mg/dL (ref 70–99)
GLUCOSE-CAPILLARY: 48 mg/dL — AB (ref 70–99)
GLUCOSE-CAPILLARY: 91 mg/dL (ref 70–99)
GLUCOSE-CAPILLARY: 81 mg/dL (ref 70–99)
GLUCOSE-CAPILLARY: 78 mg/dL (ref 70–99)
GLUCOSE-CAPILLARY: 96 mg/dL (ref 70–99)
GLUCOSE-CAPILLARY: 136 mg/dL — AB (ref 70–99)
GLUCOSE-CAPILLARY: 178 mg/dL — AB (ref 70–99)
Glucose-Capillary: 146 mg/dL — ABNORMAL HIGH (ref 70–99)
Glucose-Capillary: 77 mg/dL (ref 70–99)

## 2013-08-27 LAB — BASIC METABOLIC PANEL
BUN: 56 mg/dL — ABNORMAL HIGH (ref 6–23)
CO2: 20 mEq/L (ref 19–32)
CREATININE: 3.56 mg/dL — AB (ref 0.50–1.35)
Calcium: 8.6 mg/dL (ref 8.4–10.5)
Chloride: 97 mEq/L (ref 96–112)
GFR calc Af Amer: 16 mL/min — ABNORMAL LOW (ref >90)
GFR, EST NON AFRICAN AMERICAN: 14 mL/min — AB (ref >90)
Glucose, Bld: 106 mg/dL — ABNORMAL HIGH (ref 70–99)
Potassium: 4.7 mEq/L (ref 3.7–5.3)
SODIUM: 132 meq/L — AB (ref 137–147)

## 2013-08-27 LAB — CBC
HCT: 26.3 % — ABNORMAL LOW (ref 39.0–52.0)
Hemoglobin: 9 g/dL — ABNORMAL LOW (ref 13.0–17.0)
MCH: 33.3 pg (ref 26.0–34.0)
MCHC: 34.2 g/dL (ref 30.0–36.0)
MCV: 97.4 fL (ref 78.0–100.0)
PLATELETS: 191 10*3/uL (ref 150–400)
RBC: 2.7 MIL/uL — AB (ref 4.22–5.81)
RDW: 18.3 % — ABNORMAL HIGH (ref 11.5–15.5)
WBC: 9.3 10*3/uL (ref 4.0–10.5)

## 2013-08-27 MED ORDER — FUROSEMIDE 10 MG/ML IJ SOLN
40.0000 mg | Freq: Every day | INTRAMUSCULAR | Status: DC
  Administered 2013-08-27: 40 mg via INTRAVENOUS
  Filled 2013-08-27: qty 4

## 2013-08-27 MED ORDER — DEXTROSE 10 % IV SOLN
INTRAVENOUS | Status: DC
  Administered 2013-08-27: 11:00:00 via INTRAVENOUS
  Filled 2013-08-27: qty 1000

## 2013-08-27 NOTE — Progress Notes (Signed)
[facility] PROGRESS NOTE    [name] Flynn QAS:[id number] DOB: May 13, 1924 DOA: 08/25/2013 PCP: Criselda Peaches, MD  HPI/Brief narrative 80 y.o. male , resident of [facility], PMH of DM 2 with renal complications, HTN, CAD, hyperlipidemia, stage IV chronic kidney disease, left perihilar renal mass followed by oncology, RBBB, was referred by his PCPs office for evaluation and management of anemia. Patient presented with generalized weakness   Assessment/Plan:  1. 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 Admit to telemetry. Followup anemia panel. Rectal exam by EDP showed brown stools and was FOBT negative. Improved s/p 2 PRBC's. OP follow up with oncology. Stable 2. Acute on Chronic Diastolic CHF(Unknown type): LVEF 79-89%, grade 2 diastolic dysfunction and moderate pulmonary hypertension. Continue IV Lasix- reduce dose due to elevated creatinine. Better 3. Stage IV chronic kidney disease: Patient declines hemodialysis. No urgent indication for dialysis. Consider outpatient nephrology consultation and followup. Creatinine slightly higher-will cut back on diuretics. 4. Type II DM with renal complications/Hypoglycemia: Likely due to poor excretion of prior PO meds and worsening renal failure. DC oral hypoglycemics. DC SSI.Hypoglycemia protocol. Continue D10W-better. Monitor. 5. Acute hypoxic respiratory failure: Secondary to decompensated CHF and interstitial lung disease. Treatment as above 6. Hypertension: Continue atenolol. 7. Left kidney perihilar mass: Outpatient followup with oncology. 8. CAD: Asymptomatic of chest pain.    Code Status: DNR Family Communication: None Disposition Plan: NH when stable   Consultants:  None  Procedures:  None  Antibiotics:  None   Subjective: Dyspnea better. Persistently  hypoglycemic.  Objective: Filed Vitals:   08/26/13 1425 08/26/13 2326 08/27/13 0500 08/27/13 1431  BP: 131/67 111/51 118/70 107/44  Pulse: 67 63 68 66  Temp: 98.2 F (36.8 C) 98.5 F (36.9 C) 98.7 F (37.1 C) 98 F (36.7 C)  TempSrc: Oral Oral Oral Axillary  Resp: 20 20 22 20   Height:      Weight:   69.945 kg (154 lb 3.2 oz)   SpO2: 94% 95% 95% 95%    Intake/Output Summary (Last 24 hours) at 08/27/13 1721 Last data filed at 08/27/13 1658  Gross per 24 hour  Intake 630.33 ml  Output   2050 ml  Net -1419.67 ml   Filed Weights   08/25/13 1802 08/26/13 0654 08/27/13 0500  Weight: 74.2 kg (163 lb 9.3 oz) 70.7 kg (155 lb 13.8 oz) 69.945 kg (154 lb 3.2 oz)     Exam:  General exam: Sitting up in chair and appears comfortable Respiratory system: Basal crackles. No increased work of breathing. Cardiovascular system: S1 & S2 heard, RRR. No JVD, murmurs, gallops, clicks or pedal edema. Tele: SR Gastrointestinal system: Abdomen is nondistended, soft and nontender. Normal bowel sounds heard. Central nervous system: Alert and oriented. No focal neurological deficits. Extremities: Symmetric 5 x 5 power.   Data Reviewed: Basic Metabolic Panel:  Recent Labs Lab 08/25/13 1005 08/26/13 0447 08/27/13 0344  NA 134* 139 132*  K 4.5 4.2 4.7  CL 102 103 97  CO2 17* 19 20  GLUCOSE 141* 83 106*  BUN 49* 48* 56*  CREATININE 3.14* 3.15* 3.56*  CALCIUM 8.4 9.0 8.6   Liver Function Tests:  Recent Labs Lab 08/25/13 1005  AST 14  ALT 15  ALKPHOS 68  BILITOT 0.3  PROT 7.6  ALBUMIN 2.9*   No results found for this basename: LIPASE, AMYLASE,  in the last 168 hours No results found  for this basename: AMMONIA,  in the last 168 hours CBC:  Recent Labs Lab 08/25/13 1130 08/25/13 2103 08/26/13 0447 08/27/13 0344  WBC 8.7  --  11.3* 9.3  HGB 6.4*  6.4* 9.3* 10.1* 9.0*  HCT 19.6*  19.5* 28.3* 31.0* 26.3*  MCV 104.3*  --  100.0 97.4  PLT 207  --  237 191   Cardiac  Enzymes: No results found for this basename: CKTOTAL, CKMB, CKMBINDEX, TROPONINI,  in the last 168 hours BNP (last 3 results) No results found for this basename: PROBNP,  in the last 8760 hours CBG:  Recent Labs Lab 08/27/13 1007 08/27/13 1142 08/27/13 1213 08/27/13 1306 08/27/13 1626  GLUCAP 81 78 77 96 136*    Recent Results (from the past 240 hour(s))  MRSA PCR SCREENING     Status: None   Collection Time    08/26/13  5:10 AM      Result Value Range Status   MRSA by PCR NEGATIVE  NEGATIVE Final   Comment:            The GeneXpert MRSA Assay (FDA     approved for NASAL specimens     only), is one component of a     comprehensive MRSA colonization     surveillance program. It is not     intended to diagnose MRSA     infection nor to guide or     monitor treatment for     MRSA infections.      Studies: Dg Chest 2 View  08/27/2013   CLINICAL DATA:  Congestive failure, hypertension  EXAM: CHEST  2 VIEW  COMPARISON:  DG CHEST 2 VIEW dated 08/25/2013; DG CHEST 2 VIEW dated 03/07/2012; CT CHEST W/O CM dated 12/01/2012  FINDINGS: There is bilateral diffuse interstitial thickening likely chronic. The degree of interstitial prominence is greater then compared with 03/07/2012. Mild superimposed interstitial edema or infection cannot be completely excluded. There is no focal consolidation, pleural effusion or pneumothorax. There is stable cardiomegaly. The osseous structures are unremarkable.  IMPRESSION: Chronic bilateral interstitial disease. The interstitial prominence is greater compared with 03/07/2012 concerning for possible superimposed interstitial edema or infection.   Electronically Signed   By: Kathreen Devoid   On: 08/27/2013 08:30        Scheduled Meds: . allopurinol  300 mg Oral QODAY  . aspirin  81 mg Oral q morning - 10a  . atenolol  50 mg Oral q morning - 10a  . atorvastatin  20 mg Oral q1800  . enoxaparin (LOVENOX) injection  30 mg Subcutaneous Q24H  . furosemide  40  mg Intravenous Daily  . multivitamin with minerals  1 tablet Oral q morning - 10a  . sodium chloride  3 mL Intravenous Q12H  . tamsulosin  0.4 mg Oral q morning - 10a   Continuous Infusions: . dextrose 20 mL/hr at 08/27/13 1129    Principal Problem:   Anemia Active Problems:   Hypertension   CAD (coronary artery disease)   Hyperlipemia   DM type 2 causing renal disease   Chronic kidney disease, stage IV (severe)    Time spent: 45 minutes.    Vernell Leep, MD, FACP, FHM. [facility] Pager [phone]  If 7PM-7AM, please contact night-coverage [email or web address] Password [email or web address] 08/27/2013, 5:21 PM    LOS: 2 days

## 2013-08-27 NOTE — Progress Notes (Signed)
Physical Therapy Treatment Patient Details Name: SHAKIR PETROSINO MRN: [id number] DOB: 1924-02-17 Today's Date: 08/27/2013 Time: 1478-2956 PT Time Calculation (min): 27 min  SATURATION QUALIFICATIONS: (This note is used to comply with regulatory documentation for home oxygen)  Patient Saturations on Room Air at Rest = 91%   Patient Saturations on Room Air while Ambulating = 82%  Patient Saturations on 2 Liters of oxygen while Ambulating = 87%  Please briefly explain why patient needs home oxygen: to maintain O2 saturation at safe levels while mobilizing/ambulating  PT Assessment / Plan / Recommendation  History of Present Illness 80 yo male admitted with anemia, weakness. Hx of DM, HTN. Pt is from [place].    PT Comments   Pt appeared more lethargic today on 1st walk but then his alertness improved on 2nd walk. Pt states he always gets "sleepy" after breakfast. Noted pt had low blood sugar reading this am. Attempted to get consistent O2 readings again today while ambulating-unsure if these were accurate due to difficulty. Nursing may need to assess pt again. Also, in the event pt needs to d/c home with O2 he will need a small shoulder carry/portable tank so he can continue to use his walker to ambulate.   Follow Up Recommendations  Home health PT     Does the patient have the potential to tolerate intense rehabilitation     Barriers to Discharge        Equipment Recommendations  None recommended by PT    Recommendations for Other Services OT consult  Frequency Min 3X/week   Progress towards PT Goals Progress towards PT goals: Progressing toward goals  Plan Current plan remains appropriate    Precautions / Restrictions Precautions Precautions: Fall Restrictions Weight Bearing Restrictions: No   Pertinent Vitals/Pain Pt denies pain    Mobility  Bed Mobility Overal bed mobility: Modified Independent Transfers Overall transfer level: Needs  assistance Transfers: Sit to/from Stand Sit to Stand: Supervision General transfer comment: VCS  hand placement Ambulation/Gait Ambulation/Gait assistance: Supervision Ambulation Distance (Feet): 150 Feet (x2) Assistive device: Rolling walker (2 wheeled) Gait Pattern/deviations: Step-through pattern General Gait Details: slow gait speed. Dyspnea 2/4 with ambulation. Very difficult to get consistent reading on O2 monitor so unsure of accuracy.     Exercises     PT Diagnosis:    PT Problem List:   PT Treatment Interventions:     PT Goals (current goals can now be found in the care plan section)    Visit Information  Last PT Received On: 08/27/13 Assistance Needed: +1 History of Present Illness: 80 yo male admitted with anemia, weakness. Hx of DM, HTN. Pt is from [place].     Subjective Data      Cognition  Cognition Arousal/Alertness: Awake/alert Behavior During Therapy: WFL for tasks assessed/performed Overall Cognitive Status: Within Functional Limits for tasks assessed    Balance     End of Session PT - End of Session Equipment Utilized During Treatment: Oxygen;Gait belt Activity Tolerance: Patient tolerated treatment well Patient left: in chair;with call bell/phone within reach   GP     [name], MPT Pager: [phone]

## 2013-08-27 NOTE — Progress Notes (Signed)
Spoke with pt concerning Home Health, pt declined at present time.

## 2013-08-27 NOTE — Progress Notes (Signed)
  Echocardiogram 2D Echocardiogram has been performed.  Diamond Nickel 08/27/2013, 3:44 PM

## 2013-08-28 ENCOUNTER — Inpatient Hospital Stay (HOSPITAL_COMMUNITY): Payer: Medicare Other

## 2013-08-28 LAB — BASIC METABOLIC PANEL
BUN: 61 mg/dL — ABNORMAL HIGH (ref 6–23)
CO2: 21 meq/L (ref 19–32)
Calcium: 8.3 mg/dL — ABNORMAL LOW (ref 8.4–10.5)
Chloride: 95 mEq/L — ABNORMAL LOW (ref 96–112)
Creatinine, Ser: 3.93 mg/dL — ABNORMAL HIGH (ref 0.50–1.35)
GFR calc Af Amer: 15 mL/min — ABNORMAL LOW (ref >90)
GFR calc non Af Amer: 13 mL/min — ABNORMAL LOW (ref >90)
Glucose, Bld: 140 mg/dL — ABNORMAL HIGH (ref 70–99)
POTASSIUM: 4.2 meq/L (ref 3.7–5.3)
SODIUM: 131 meq/L — AB (ref 137–147)

## 2013-08-28 LAB — CBC
HCT: 26.2 % — ABNORMAL LOW (ref 39.0–52.0)
HEMOGLOBIN: 8.6 g/dL — AB (ref 13.0–17.0)
MCH: 32.5 pg (ref 26.0–34.0)
MCHC: 32.8 g/dL (ref 30.0–36.0)
MCV: 98.9 fL (ref 78.0–100.0)
Platelets: 181 10*3/uL (ref 150–400)
RBC: 2.65 MIL/uL — AB (ref 4.22–5.81)
RDW: 17.3 % — ABNORMAL HIGH (ref 11.5–15.5)
WBC: 10.7 10*3/uL — AB (ref 4.0–10.5)

## 2013-08-28 LAB — GLUCOSE, CAPILLARY
GLUCOSE-CAPILLARY: 178 mg/dL — AB (ref 70–99)
GLUCOSE-CAPILLARY: 145 mg/dL — AB (ref 70–99)
Glucose-Capillary: 131 mg/dL — ABNORMAL HIGH (ref 70–99)

## 2013-08-28 MED ORDER — FUROSEMIDE 10 MG/ML IJ SOLN: 40.0000 mg | Freq: Every day | INTRAMUSCULAR | Status: DC

## 2013-08-28 NOTE — Progress Notes (Signed)
O2 Sats at rest on 2 L/min - 97% At rest without O2 - 87% Post ambulation on 2L/min 89%

## 2013-08-28 NOTE — Progress Notes (Addendum)
[facility] PROGRESS NOTE    [name] Flynn GGY:[id number] DOB: 1924/05/15 DOA: 08/25/2013 PCP: Criselda Peaches, MD  HPI/Brief narrative 80 y.o. male , resident of [facility], PMH of DM 2 with renal complications, HTN, CAD, hyperlipidemia, stage IV chronic kidney disease, left perihilar renal mass followed by oncology, RBBB, was referred by his PCPs office for evaluation and management of anemia. Patient presented with generalized weakness   Assessment/Plan:  1. 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 Rectal exam by EDP showed brown stools and was FOBT negative. Improved s/p 2 PRBC's. OP follow up with oncology. Stable 2. Acute on Chronic Diastolic CHF: LVEF 03-50%, grade 2 diastolic dysfunction and moderate pulmonary hypertension. Creatinine has gradually increased. Hold Lasix for today. Consider switching to oral Lasix in a.m. Improving 3. Stage IV chronic kidney disease: Patient declines hemodialysis. No urgent indication for dialysis. Consider outpatient nephrology consultation and followup. Creatinine has gradually increased 2.15 > 3.56 > 3.93-secondary to diuresis. Hold Lasix today in followup BMP in a.m. Check renal ultrasound: Renal ultrasound in April 2014 had shown bilateral large perirenal masses and mild to moderate bilateral hydronephrosis. 4. Type II DM with renal complications/Hypoglycemia: Likely due to poor excretion of prior PO meds and worsening renal failure. DC oral hypoglycemics. DC SSI.Hypoglycemia protocol. CBGs have stabilized and starting to rise. DC D10W and monitor. 5. Acute hypoxic respiratory failure: Secondary to decompensated CHF and interstitial lung disease. Treatment as above 6. Hypertension: Continue atenolol. 7. Left kidney perihilar mass: Outpatient followup with oncology. 8. CAD: Asymptomatic of chest  pain.    Code Status: DNR Family Communication: Patient declines MD offer to discuss care with family. Disposition Plan: NH when stable   Consultants:  None  Procedures:  None  Antibiotics:  None   Subjective: Denies dyspnea.  Objective: Filed Vitals:   08/27/13 0500 08/27/13 1431 08/27/13 2248 08/28/13 0628  BP: 118/70 107/44 126/61 141/70  Pulse: 68 66 69 64  Temp: 98.7 F (37.1 C) 98 F (36.7 C) 98.1 F (36.7 C) 97.8 F (36.6 C)  TempSrc: Oral Axillary Axillary Oral  Resp: 22 20 20 20   Height:      Weight: 69.945 kg (154 lb 3.2 oz)   71.1 kg (156 lb 12 oz)  SpO2: 95% 95% 92% 93%    Intake/Output Summary (Last 24 hours) at 08/28/13 1244 Last data filed at 08/28/13 1115  Gross per 24 hour  Intake 625.33 ml  Output   2400 ml  Net -1774.67 ml   Filed Weights   08/26/13 0654 08/27/13 0500 08/28/13 0628  Weight: 70.7 kg (155 lb 13.8 oz) 69.945 kg (154 lb 3.2 oz) 71.1 kg (156 lb 12 oz)     Exam:  General exam: Lying comfortably in bed. Respiratory system: Few Velcro-like/chronic appearing basal crackles. Rest of lung fields clear to auscultation. No increased work of breathing. Cardiovascular system: S1 & S2 heard, RRR. No JVD, murmurs, gallops, clicks or pedal edema. Gastrointestinal system: Abdomen is nondistended, soft and nontender. Normal bowel sounds heard. Central nervous system: Alert and oriented. No focal neurological deficits. Extremities: Symmetric 5 x 5 power.   Data Reviewed: Basic Metabolic Panel:  Recent Labs Lab 08/25/13 1005 08/26/13 0447 08/27/13 0344 08/28/13 0433  NA 134* 139 132* 131*  K 4.5 4.2 4.7 4.2  CL 102 103 97 95*  CO2 17* 19 20 21   GLUCOSE 141* 83 106* 140*  BUN 49* 48* 56* 61*  CREATININE 3.14* 3.15* 3.56* 3.93*  CALCIUM 8.4 9.0 8.6 8.3*   Liver Function Tests:  Recent Labs Lab 08/25/13 1005  AST 14  ALT 15  ALKPHOS 68  BILITOT 0.3  PROT 7.6  ALBUMIN 2.9*   No results found for this basename:  LIPASE, AMYLASE,  in the last 168 hours No results found for this basename: AMMONIA,  in the last 168 hours CBC:  Recent Labs Lab 08/25/13 1130 08/25/13 2103 08/26/13 0447 08/27/13 0344 08/28/13 0433  WBC 8.7  --  11.3* 9.3 10.7*  HGB 6.4*  6.4* 9.3* 10.1* 9.0* 8.6*  HCT 19.6*  19.5* 28.3* 31.0* 26.3* 26.2*  MCV 104.3*  --  100.0 97.4 98.9  PLT 207  --  237 191 181   Cardiac Enzymes: No results found for this basename: CKTOTAL, CKMB, CKMBINDEX, TROPONINI,  in the last 168 hours BNP (last 3 results) No results found for this basename: PROBNP,  in the last 8760 hours CBG:  Recent Labs Lab 08/27/13 1306 08/27/13 1626 08/27/13 2247 08/28/13 0732 08/28/13 1208  GLUCAP 96 136* 178* 131* 178*    Recent Results (from the past 240 hour(s))  MRSA PCR SCREENING     Status: None   Collection Time    08/26/13  5:10 AM      Result Value Range Status   MRSA by PCR NEGATIVE  NEGATIVE Final   Comment:            The GeneXpert MRSA Assay (FDA     approved for NASAL specimens     only), is one component of a     comprehensive MRSA colonization     surveillance program. It is not     intended to diagnose MRSA     infection nor to guide or     monitor treatment for     MRSA infections.      Studies: Dg Chest 2 View  08/27/2013   CLINICAL DATA:  Congestive failure, hypertension  EXAM: CHEST  2 VIEW  COMPARISON:  DG CHEST 2 VIEW dated 08/25/2013; DG CHEST 2 VIEW dated 03/07/2012; CT CHEST W/O CM dated 12/01/2012  FINDINGS: There is bilateral diffuse interstitial thickening likely chronic. The degree of interstitial prominence is greater then compared with 03/07/2012. Mild superimposed interstitial edema or infection cannot be completely excluded. There is no focal consolidation, pleural effusion or pneumothorax. There is stable cardiomegaly. The osseous structures are unremarkable.  IMPRESSION: Chronic bilateral interstitial disease. The interstitial prominence is greater compared with  03/07/2012 concerning for possible superimposed interstitial edema or infection.   Electronically Signed   By: Kathreen Devoid   On: 08/27/2013 08:30        Scheduled Meds: . allopurinol  300 mg Oral QODAY  . aspirin  81 mg Oral q morning - 10a  . atenolol  50 mg Oral q morning - 10a  . atorvastatin  20 mg Oral q1800  . enoxaparin (LOVENOX) injection  30 mg Subcutaneous Q24H  . [START ON 08/29/2013] furosemide  40 mg Intravenous Daily  . multivitamin with minerals  1 tablet Oral q morning - 10a  . sodium chloride  3 mL Intravenous Q12H  . tamsulosin  0.4 mg Oral q morning - 10a   Continuous Infusions: . dextrose 20 mL/hr at 08/27/13 1129    Principal Problem:   Anemia Active Problems:   Hypertension   CAD (coronary artery disease)   Hyperlipemia   DM type 2 causing  renal disease   Chronic kidney disease, stage IV (severe)   Acute on chronic diastolic CHF (congestive heart failure)   Diabetic hypoglycemia   Acute respiratory failure with hypoxia    Time spent: 25 minutes.    Vernell Leep, MD, FACP, FHM. [facility] Pager [phone]  If 7PM-7AM, please contact night-coverage [email or web address] Password [email or web address] 08/28/2013, 12:44 PM    LOS: 3 days

## 2013-08-29 ENCOUNTER — Inpatient Hospital Stay (HOSPITAL_COMMUNITY): Payer: Medicare Other

## 2013-08-29 DIAGNOSIS — N189 Chronic kidney disease, unspecified: Secondary | ICD-10-CM

## 2013-08-29 DIAGNOSIS — N179 Acute kidney failure, unspecified: Secondary | ICD-10-CM | POA: Diagnosis not present

## 2013-08-29 LAB — GLUCOSE, CAPILLARY
Glucose-Capillary: 117 mg/dL — ABNORMAL HIGH (ref 70–99)
Glucose-Capillary: 98 mg/dL (ref 70–99)
Glucose-Capillary: 115 mg/dL — ABNORMAL HIGH (ref 70–99)
Glucose-Capillary: 129 mg/dL — ABNORMAL HIGH (ref 70–99)

## 2013-08-29 LAB — BASIC METABOLIC PANEL
BUN: 61 mg/dL — ABNORMAL HIGH (ref 6–23)
CHLORIDE: 102 meq/L (ref 96–112)
CO2: 21 mEq/L (ref 19–32)
CREATININE: 4.02 mg/dL — AB (ref 0.50–1.35)
Calcium: 8.3 mg/dL — ABNORMAL LOW (ref 8.4–10.5)
GFR, EST AFRICAN AMERICAN: 14 mL/min — AB (ref >90)
GFR, EST NON AFRICAN AMERICAN: 12 mL/min — AB (ref >90)
Glucose, Bld: 119 mg/dL — ABNORMAL HIGH (ref 70–99)
Potassium: 4.4 mEq/L (ref 3.7–5.3)
Sodium: 137 mEq/L (ref 137–147)

## 2013-08-29 LAB — CBC
HCT: 27.5 % — ABNORMAL LOW (ref 39.0–52.0)
Hemoglobin: 8.9 g/dL — ABNORMAL LOW (ref 13.0–17.0)
MCH: 32.2 pg (ref 26.0–34.0)
MCHC: 32.4 g/dL (ref 30.0–36.0)
MCV: 99.6 fL (ref 78.0–100.0)
PLATELETS: 199 10*3/uL (ref 150–400)
RBC: 2.76 MIL/uL — ABNORMAL LOW (ref 4.22–5.81)
RDW: 17.2 % — AB (ref 11.5–15.5)
WBC: 10 10*3/uL (ref 4.0–10.5)

## 2013-08-29 MED ORDER — IOHEXOL 300 MG/ML  SOLN
50.0000 mL | Freq: Once | INTRAMUSCULAR | Status: AC | PRN
  Administered 2013-08-29: 10:00:00 50 mL via ORAL

## 2013-08-29 NOTE — Progress Notes (Addendum)
[facility] PROGRESS NOTE    [name] Flynn [id number] DOB: 02/22/1924 DOA: 08/25/2013 PCP: Criselda Peaches, MD  HPI/Brief narrative 80 y.o. male , resident of [facility], PMH of DM 2 with renal complications, HTN, CAD, hyperlipidemia, stage IV chronic kidney disease, left perihilar renal mass followed by oncology, RBBB, was referred by his PCPs office for evaluation and management of anemia. Patient presented with generalized weakness   Assessment/Plan:  1. 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 Rectal exam by EDP showed brown stools and was FOBT negative. Improved s/p 2 PRBC's. OP follow up with oncology. Stable 2. Acute on Chronic Diastolic CHF: LVEF [id number], grade 2 diastolic dysfunction and moderate pulmonary hypertension. Creatinine has gradually increased. Holding Lasix. Improving 3. Acute on Stage IV chronic kidney disease/rule out obstructive uropathy: Patient declines hemodialysis. No urgent indication for dialysis. Consider outpatient nephrology consultation and followup. Creatinine has gradually increased 2.15 > 3.56 > 3.93 >4.02-secondary to diuresis. Hold Lasix today in followup BMP in a.m. renal ultrasound shows mild to moderate bilateral hydronephrosis. Urology consulted-recommended Foley catheter and repeat CT abdomen and pelvis without contrast to evaluate perirenal masses which were present in April 2014. Trend BMP. Will discuss with oncology tomorrow. Creatinine seems to have plateaued in the last 24 hours. 4. Type II DM with renal complications/Hypoglycemia: Likely due to poor excretion of prior PO meds and worsening renal failure. DC oral hypoglycemics. DC SSI.Hypoglycemia protocol. CBGs have stabilized and starting to rise. DC D10W and monitor. Stable. 5. Acute hypoxic respiratory failure: Secondary to decompensated CHF  and interstitial lung disease. Treatment as above 6. Hypertension: Continue atenolol. 7. Left kidney perihilar mass: Outpatient followup with oncology. Followup CT abdomen 8. CAD: Asymptomatic of chest pain.    Code Status: DNR Family Communication: Patient declines MD offer to discuss care with family. Disposition Plan: ? Patient lived in independent living prior to admission. Return at discharge versus higher level of care   Consultants:  Urology  Procedures:  Foley catheter 1/11 >  Antibiotics:  None   Subjective: Denies dyspnea. Denies any other complaints.  Objective: Filed Vitals:   08/28/13 1412 08/28/13 2305 08/29/13 0642 08/29/13 0643  BP: 114/62 146/72  140/66  Pulse: 65 66  62  Temp: 97.5 F (36.4 C) 97.4 F (36.3 C)  97.3 F (36.3 C)  TempSrc: Oral Oral  Oral  Resp: 20 20  20   Height:      Weight:   70.9 kg (156 lb 4.9 oz)   SpO2: 98% 98%  98%    Intake/Output Summary (Last 24 hours) at 08/29/13 1335 Last data filed at 08/29/13 1319  Gross per 24 hour  Intake    960 ml  Output   2036 ml  Net  -1076 ml   Filed Weights   08/27/13 0500 08/28/13 0628 08/29/13 0642  Weight: 69.945 kg (154 lb 3.2 oz) 71.1 kg (156 lb 12 oz) 70.9 kg (156 lb 4.9 oz)     Exam:  General exam: Lying comfortably in bed. Respiratory system: Few Velcro-like/chronic appearing basal crackles. Rest of lung fields clear to auscultation. No increased work of breathing. Cardiovascular system: S1 & S2 heard, RRR. No JVD, murmurs, gallops, clicks or pedal edema. Telemetry: Sinus bradycardia in the 50s to sinus rhythm in the 60s with first degree AV block Gastrointestinal system: Abdomen is nondistended, soft and nontender. Normal bowel sounds heard. Central nervous system: Alert  and oriented. No focal neurological deficits. Extremities: Symmetric 5 x 5 power.   Data Reviewed: Basic Metabolic Panel:  Recent Labs Lab 08/25/13 1005 08/26/13 0447 08/27/13 0344 08/28/13 0433  08/29/13 0523  NA 134* 139 132* 131* 137  K 4.5 4.2 4.7 4.2 4.4  CL 102 103 97 95* 102  CO2 17* 19 20 21 21   GLUCOSE 141* 83 106* 140* 119*  BUN 49* 48* 56* 61* 61*  CREATININE 3.14* 3.15* 3.56* 3.93* 4.02*  CALCIUM 8.4 9.0 8.6 8.3* 8.3*   Liver Function Tests:  Recent Labs Lab 08/25/13 1005  AST 14  ALT 15  ALKPHOS 68  BILITOT 0.3  PROT 7.6  ALBUMIN 2.9*   No results found for this basename: LIPASE, AMYLASE,  in the last 168 hours No results found for this basename: AMMONIA,  in the last 168 hours CBC:  Recent Labs Lab 08/25/13 1130 08/25/13 2103 08/26/13 0447 08/27/13 0344 08/28/13 0433 08/29/13 0523  WBC 8.7  --  11.3* 9.3 10.7* 10.0  HGB 6.4*  6.4* 9.3* 10.1* 9.0* 8.6* 8.9*  HCT 19.6*  19.5* 28.3* 31.0* 26.3* 26.2* 27.5*  MCV 104.3*  --  100.0 97.4 98.9 99.6  PLT 207  --  237 191 181 199   Cardiac Enzymes: No results found for this basename: CKTOTAL, CKMB, CKMBINDEX, TROPONINI,  in the last 168 hours BNP (last 3 results) No results found for this basename: PROBNP,  in the last 8760 hours CBG:  Recent Labs Lab 08/28/13 1208 08/28/13 1704 08/28/13 2302 08/29/13 0856 08/29/13 1234  GLUCAP 178* 145* 117* 98 115*    Recent Results (from the past 240 hour(s))  MRSA PCR SCREENING     Status: None   Collection Time    08/26/13  5:10 AM      Result Value Range Status   MRSA by PCR NEGATIVE  NEGATIVE Final   Comment:            The GeneXpert MRSA Assay (FDA     approved for NASAL specimens     only), is one component of a     comprehensive MRSA colonization     surveillance program. It is not     intended to diagnose MRSA     infection nor to guide or     monitor treatment for     MRSA infections.      Studies: US Renal  08/28/2013   CLINICAL DATA:  Chronic kidney disease, evaluate for hydronephrosis.  EXAM: RENAL/URINARY TRACT ULTRASOUND COMPLETE  COMPARISON:  12/08/2012, 12/01/2012  FINDINGS: Right Kidney:  Length: 10.9 cm length. Moderate  diffuse hydronephrosis. Normal cortical echogenicity. Small incidental right upper pole cortical cyst measures 8 mm. Ill-defined hypoechoic right inferior pole solid area measures 5.0 x 3.6 x 4.2 cm compatible with a known perinephric right renal mass.  Left Kidney:  Length: 11.9 cm. Mild to moderate hydronephrosis. Slight increased echogenicity of the renal parenchyma. 2 large solid areas noted in the left kidney in the midpole measures 5.5 x 4.9 x 4.8 cm. Irregular left inferior pole solid mass measures 4.2 x 3.6 x 3.9 cm.  Bladder:  Grossly unremarkable by ultrasound. Right ureteral jet demonstrated. Left ureteral jet not seen.  IMPRESSION: Mild to moderate bilateral hydronephrosis.  Bilateral known solid hypoechoic renal/perirenal masses   Electronically Signed   By: Daryll Brod M.D.   On: 08/28/2013 13:05        Scheduled Meds: . allopurinol  300 mg Oral QODAY  .  aspirin  81 mg Oral q morning - 10a  . atenolol  50 mg Oral q morning - 10a  . atorvastatin  20 mg Oral q1800  . enoxaparin (LOVENOX) injection  30 mg Subcutaneous Q24H  . multivitamin with minerals  1 tablet Oral q morning - 10a  . sodium chloride  3 mL Intravenous Q12H  . tamsulosin  0.4 mg Oral q morning - 10a   Continuous Infusions:    Principal Problem:   Anemia Active Problems:   Hypertension   CAD (coronary artery disease)   Hyperlipemia   DM type 2 causing renal disease   Chronic kidney disease, stage IV (severe)   Acute on chronic diastolic CHF (congestive heart failure)   Diabetic hypoglycemia   Acute respiratory failure with hypoxia    Time spent: 45 minutes.    Vernell Leep, MD, FACP, FHM. [facility] Pager [phone]  If 7PM-7AM, please contact night-coverage [email or web address] Password [email or web address] 08/29/2013, 1:35 PM    LOS: 4 days

## 2013-08-29 NOTE — Consult Note (Signed)
CC: bilateral hydronephrosis  HPI: 80 yo male with a history of Stage IV CKD, bilateral perihilar renal masses being followed by oncology.  He has known mild to moderate bilateral hydronephrosis.  He was admitted for anemia and was found to have a rising creatinine.  Repeat renal ultrasound shows stable bilateral hydronephrosis.  He is urinating clear urine and having adequate urine output.  He denies flank pain, fevers, nausea, vomiting.  No complaints currently.  He thinks he was seen by someone with [facility], but is unsure who that was.  Past Medical History  Diagnosis Date  . Diabetes mellitus   . Hypertension   . Pancreatitis   . CAD (coronary artery disease)   . Hyperlipemia   . Anemia     macrocytic  . DM type 2 causing renal disease   . Chronic kidney disease, stage IV (severe)    Past Surgical History  Procedure Laterality Date  . Appendectomy    . Cholecystectomy    . Angioplasty    . Ureteral stent placement      Left  . Orif humerus fracture  03/07/2012    Procedure: OPEN REDUCTION INTERNAL FIXATION (ORIF) PROXIMAL HUMERUS FRACTURE;  Surgeon: Augustin Schooling, MD;  Location: [facility];  Service: Orthopedics;  Laterality: Right;   History   Social History  . Marital Status: Single    Spouse Name: N/A    Number of Children: N/A  . Years of Education: N/A   Occupational History  . Not on file.   Social History Main Topics  . Smoking status: Never Smoker   . Smokeless tobacco: Not on file  . Alcohol Use: No  . Drug Use: No  . Sexual Activity: Not Currently   Other Topics Concern  . Not on file   Social History Narrative  . No narrative on file   No Known Allergies No current facility-administered medications on file prior to encounter.   Current Outpatient Prescriptions on File Prior to Encounter  Medication Sig Dispense Refill  . allopurinol (ZYLOPRIM) 300 MG tablet Take 300 mg by mouth every other day.       Marland Kitchen aspirin 81 MG tablet Take 81 mg by  mouth every morning.       Marland Kitchen atenolol (TENORMIN) 50 MG tablet Take 50 mg by mouth every morning.       Marland Kitchen glimepiride (AMARYL) 2 MG tablet Take 2 mg by mouth daily before breakfast.        . Multiple Vitamin (MULTIVITAMIN) tablet Take 1 tablet by mouth every morning.       . rosuvastatin (CRESTOR) 10 MG tablet Take 10 mg by mouth every other day.        Review of Systems - Negative except as per HPI  BP 140/66  Pulse 62  Temp(Src) 97.3 F (36.3 C) (Oral)  Resp 20  Ht 5\' 8"  (1.727 m)  Wt 70.9 kg (156 lb 4.9 oz)  BMI 23.77 kg/m2  SpO2 98% NAD NCAT Normal affect, answers questions appropriately  CN II-XII grossly intact RRR Breathing unlabored Soft, nt, nd No CVAT Foley in place and draining clear yellow urine.  Lab Results  Component Value Date   WBC 10.0 08/29/2013   HGB 8.9* 08/29/2013   HCT 27.5* 08/29/2013   MCV 99.6 08/29/2013   PLT 199 08/29/2013   Lab Results  Component Value Date   NA 137 08/29/2013   K 4.4 08/29/2013   CL 102 08/29/2013   CO2 21 08/29/2013  Lab Results  Component Value Date   CREATININE 4.02* 08/29/2013   Renal US: IMPRESSION:  Mild to moderate bilateral hydronephrosis.  Bilateral known solid hypoechoic renal/perirenal masses   A/P 80 yo male with bilateral hydronephrosis, known bilateral perihilar renal masses and rising creatinine -Foley in place and draining well.  I am concerned that the patient's hydronephrosis is secondary to his perihilar masses.  Would repeat a CT scan without contrast.  Last one was in April.  If he appears to have significant hydronephrosis from these masses we will need to consider nephrostomy tube placement.  If necessary, this can be done on a non-emergent basis.

## 2013-08-30 LAB — BASIC METABOLIC PANEL
BUN: 63 mg/dL — ABNORMAL HIGH (ref 6–23)
CALCIUM: 8.8 mg/dL (ref 8.4–10.5)
CO2: 19 meq/L (ref 19–32)
Chloride: 100 mEq/L (ref 96–112)
Creatinine, Ser: 3.73 mg/dL — ABNORMAL HIGH (ref 0.50–1.35)
GFR calc Af Amer: 15 mL/min — ABNORMAL LOW (ref >90)
GFR calc non Af Amer: 13 mL/min — ABNORMAL LOW (ref >90)
Glucose, Bld: 94 mg/dL (ref 70–99)
Potassium: 5.1 mEq/L (ref 3.7–5.3)
Sodium: 134 mEq/L — ABNORMAL LOW (ref 137–147)

## 2013-08-30 LAB — GLUCOSE, CAPILLARY
GLUCOSE-CAPILLARY: 87 mg/dL (ref 70–99)
GLUCOSE-CAPILLARY: 143 mg/dL — AB (ref 70–99)
GLUCOSE-CAPILLARY: 197 mg/dL — AB (ref 70–99)
Glucose-Capillary: 44 mg/dL — CL (ref 70–99)
Glucose-Capillary: 161 mg/dL — ABNORMAL HIGH (ref 70–99)
Glucose-Capillary: 142 mg/dL — ABNORMAL HIGH (ref 70–99)

## 2013-08-30 NOTE — Progress Notes (Signed)
Patient ID: [name] Flynn, male   DOB: May 12, 1924, 80 y.o.   MRN: [id number]    Subjective: Dr. Georga Kaufmann is well known to me and last was seen by me in 2012. He was seen yesterday for hydronephrosis bilaterally and a rising creatinine.    He came into the hospital due to an incidental finding of anemia on routine blood work. His baseline Cr in October 2014 was 3.2.  I had actually spoken with Dr. Alen Blew at that time and based on the patient's advanced age and after discussion with the patient, it was decided to avoid invasive procedures or measures to manage his bilateral ureteral obstruction.  The patient has remained asymptomatic from his hydronephrosis since then. His Cr rose to 4.0 during his hospitalization resulting in repeat imaging which demonstrated stable hydronephrosis with some progression of his bilateral perihilar masses. His Cr is slightly improved to 3.7 today. He denies flank pain.  Objective: Vital signs in last 24 hours: Temp:  [98.3 F (36.8 C)-98.7 F (37.1 C)] 98.3 F (36.8 C) (01/12 1401) Pulse Rate:  [63-77] 63 (01/12 1401) Resp:  [18-20] 18 (01/12 1401) BP: (130-147)/(60-70) 130/64 mmHg (01/12 1401) SpO2:  [99 %-100 %] 99 % (01/12 1401) Weight:  [70.58 kg (155 lb 9.6 oz)] 70.58 kg (155 lb 9.6 oz) (01/12 0500)  Intake/Output from previous day: 01/11 0701 - 01/12 0700 In: 360 [P.O.:360] Out: 2375 [Urine:2375] Intake/Output this shift: Total I/O In: 480 [P.O.:480] Out: 850 [Urine:850]  Physical Exam:  General: Alert and oriented Abdomen: Soft, ND, No CVAT   Lab Results:  Recent Labs  08/28/13 0433 08/29/13 0523  HGB 8.6* 8.9*  HCT 26.2* 27.5*   BMET  Recent Labs  08/29/13 0523 08/30/13 0550  NA 137 134*  K 4.4 5.1  CL 102 100  CO2 21 19  GLUCOSE 119* 94  BUN 61* 63*  CREATININE 4.02* 3.73*  CALCIUM 8.3* 8.8     Studies/Results: Ct Abdomen Pelvis Wo Contrast  08/29/2013   CLINICAL DATA:  Perirenal masses, hydronephrosis on  ultrasound, evaluate for obstructive uropathy  EXAM: CT ABDOMEN AND PELVIS WITHOUT CONTRAST  TECHNIQUE: Multidetector CT imaging of the abdomen and pelvis was performed following the standard protocol without intravenous contrast.  COMPARISON:  Renal ultrasound dated 08/28/2013. CT abdomen pelvis dated 12/01/2012.  FINDINGS: Subpleural reticulation/fibrosis at the lung bases. Trace pleural fluid bilaterally.  Unenhanced liver, spleen, and adrenal glands are within normal limits.  Pancreatic atrophy. Coarse calcifications along the pancreatic head/uncinate process, likely sequela of prior/chronic pancreatitis.  Status post cholecystectomy. No intrahepatic or extrahepatic ductal dilatation.  Kidneys are notable for bilateral hyperdense renal masses, including:  --4.5 x 5.2 cm irregular mass in the right renal sinus (series 2/ image 39), previously approximately 3.7 x 5.2 cm  --4.8 x 5.1 cm irregular mass along the posterior right upper kidney (series 2/ image 34), previously approximately 3.5 x 5.7 cm  --5.4 x 6.4 cm irregular mass along the right lower kidney/ renal sinus (series 2/ image 49), previously approximately 5.0 x 5.8 cm  Associated mild irregular dilatation of the bilateral renal collecting systems, unchanged.  No evidence of bowel obstruction. Prior appendectomy. Colonic diverticulosis, without associated inflammatory changes.  Atherosclerotic calcifications of the abdominal aorta and branch vessels. Stable displaced intimal calcification (series 2/image 29), likely related to known chronic dissection or PAU.  No abdominopelvic ascites.  Mild retroperitoneal/mesenteric lymphadenopathy, including a 1.4 x 1.8 cm left lower mesenteric node (series 2/ image 65), previously 1.2 x 1.5 cm.  Again noted is mild nodularity along the bilateral perirenal fascia, for example measuring 10 x 12 mm posteriorly on the left (series 2/ image 47), previously 9 x 12 mm.  Mild prostatomegaly.  Suspected prior TURP.  Bladder is  decompressed with indwelling Foley catheter and associated nondependent gas.  Degenerative changes of the visualized thoracolumbar spine. Stable alignment of the lower lumbar spine.  IMPRESSION: Progression of bilateral renal masses, as described above, suspicious for lymphoproliferative disorder such as lymphoma or leukemia.  Mild retroperitoneal/mesenteric lymphadenopathy and perirenal nodularity, stable versus mildly increased.  Mild irregular dilatation of the bilateral renal collecting systems, unchanged.   Electronically Signed   By: Julian Hy M.D.   On: 08/29/2013 13:35    Assessment/Plan: 1) Bilateral ureteral obstruction (partial) with perihilar masses: Although biopsies have been negative, it has been presumed to my knowledge that his masses are likely a lymphoproliferative disorder.  He has been followed by Dr. Alen Blew and I last discussed Dr. Georga Kaufmann with Dr. Alen Blew in October 2014 when his Cr rose to 3.2 My recommendation at that time was to either consider renal drainage (with ureteral stents or nephrostomy tube drainage) especially if he became symptomatic or wished to pursue aggressive treatment of his disease process vs continued observation of his renal function with hopes to avoid subjecting his to invasive procedures or treatment of his ureteral obstruction considering his advanced age. In October of 2014, it appears that Dr. Georga Kaufmann and Dr. Alen Blew chose to continue with observation. Currently, his Cr is improving and likely will return to his baseline from last October considering it was 3.15 on 08/26/13.  Therefore, I would continue to monitor his renal function with plans to have Dr. Georga Kaufmann further discuss his plans with Dr. Alen Blew as to how aggressive he wishes his care to be at this point.  If his renal function progressively deteriorates, Dr. Georga Kaufmann will then need to decide if he wishes to proceed with intervention.  He has declined dialysis previously.  I agree with  nephrology outpatient follow up. It may be that treatment of his renal dysfunction or erythropoietin supplementation would be helpful to treat his anemia although it may also be related to his chronic disease process as well.   LOS: 5 days   Orrie Schubert,LES 08/30/2013, 6:06 PM

## 2013-08-30 NOTE — Progress Notes (Addendum)
Physical Therapy Treatment Patient Details Name: GLENNIE RODDA MRN: [id number] DOB: 12-10-1923 Today's Date: 08/30/2013 Time: 5809-9833 PT Time Calculation (min): 21 min  SATURATION QUALIFICATIONS: (This note is used to comply with regulatory documentation for home oxygen)  Patient Saturations on Room Air at Rest = 93%  Patient Saturations on Room Air while Ambulating = 76%  Patient Saturations on 2-3 Liters of oxygen while Ambulating = 84%  Please briefly explain why patient needs home oxygen: to maintain safe O2 levels when mobilizing  PT Assessment / Plan / Recommendation  History of Present Illness 80 yo male admitted with anemia, weakness. Hx of DM, HTN. Pt is from [place].    PT Comments   Progressing with mobility.   Follow Up Recommendations  Home health PT     Does the patient have the potential to tolerate intense rehabilitation     Barriers to Discharge        Equipment Recommendations  None recommended by PT (will need small O2 shoulder carry tank so he can  use walker safely)    Recommendations for Other Services OT consult  Frequency Min 3X/week   Progress towards PT Goals Progress towards PT goals: Progressing toward goals  Plan Current plan remains appropriate    Precautions / Restrictions Precautions Precautions: Fall Restrictions Weight Bearing Restrictions: No   Pertinent Vitals/Pain 96% 2L rest 93% RA rest 76% RA amb 84% on 2-3L O2   Mobility  Bed Mobility Overal bed mobility: Modified Independent Transfers Overall transfer level: Needs assistance Transfers: Sit to/from Stand Sit to Stand: Supervision General transfer comment: VCs hand placement Ambulation/Gait Ambulation/Gait assistance: Min guard Ambulation Distance (Feet): 500 Feet Assistive device: Rolling walker (2 wheeled) General Gait Details: slow gait speed. Dyspnea 2/4 with ambulation. close guard for safety. VCs pursed lip breathing    Exercises     PT  Diagnosis:    PT Problem List:   PT Treatment Interventions:     PT Goals (current goals can now be found in the care plan section)    Visit Information  Last PT Received On: 08/30/13 Assistance Needed: +1 History of Present Illness: 80 yo male admitted with anemia, weakness. Hx of DM, HTN. Pt is from [place].     Subjective Data      Cognition  Cognition Arousal/Alertness: Awake/alert Behavior During Therapy: WFL for tasks assessed/performed Overall Cognitive Status: Within Functional Limits for tasks assessed    Balance     End of Session PT - End of Session Equipment Utilized During Treatment: Oxygen;Gait belt Activity Tolerance: Patient tolerated treatment well Patient left: in chair;with call bell/phone within reach;with family/visitor present   GP     [name], MPT Pager: [phone]

## 2013-08-30 NOTE — Progress Notes (Signed)
Occupational Therapy Treatment Patient Details Name: [name] Flynn MRN: [id number] DOB: 03-24-1924 Today's Date: 08/30/2013 Time: 6270-3500 OT Time Calculation (min): 19 min  OT Assessment / Plan / Recommendation  History of present illness 80 yo male admitted with anemia, weakness. Hx of DM, HTN. Pt is from [place].       Follow Up Recommendations  Home health OT       Equipment Recommendations  3 in 1 bedside comode       Frequency Min 2X/week   Progress towards OT Goals Progress towards OT goals: Progressing toward goals  Plan Discharge plan remains appropriate    Precautions / Restrictions Precautions Precautions: Fall Restrictions Weight Bearing Restrictions: No       ADL  ADL Comments: Lengthy discussion with daugther present for DC planning inregards to having  A in his apartment as well as therapy to educate and practice ADL activity. Discussed fall risk with ADL's and transfers with oxygen cord. Pt agreeable to some personal care services and therapy in his apartment.      O  OT Goals(current goals can now be found in the care plan section)    Visit Information  Last OT Received On: 08/30/13 History of Present Illness: 80 yo male admitted with anemia, weakness. Hx of DM, HTN. Pt is from [place].           Cognition  Cognition Arousal/Alertness: Awake/alert Behavior During Therapy: WFL for tasks assessed/performed Overall Cognitive Status: Within Functional Limits for tasks assessed             End of Session OT - End of Session Activity Tolerance: Patient tolerated treatment well Patient left: in chair;with call bell/phone within reach;with family/visitor present  Gould, Thereasa Parkin 08/30/2013, 2:50 PM

## 2013-08-30 NOTE — Progress Notes (Signed)
[facility] PROGRESS NOTE    [name] Flynn [id number] DOB: 10/25/1923 DOA: 08/25/2013 PCP: Criselda Peaches, MD  HPI/Brief narrative 80 y.o. male , resident of [facility], PMH of DM 2 with renal complications, HTN, CAD, hyperlipidemia, stage IV chronic kidney disease, left perihilar renal mass followed by oncology, RBBB, was referred by his PCPs office for evaluation and management of anemia. Patient presented with generalized weakness   Assessment/Plan:  1. 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 Rectal exam by EDP showed brown stools and was FOBT negative. Improved s/p 2 PRBC's. OP follow up with oncology. Stable 2. Acute on Chronic Diastolic CHF: LVEF [id number], grade 2 diastolic dysfunction and moderate pulmonary hypertension. Patient was initially placed on IV Lasix and his creatinine rose to peak of 4.02. Lasix held. Clinically not overtly volume overloaded at this time. We'll use Lasix when necessary. 3. Acute on Stage IV chronic kidney disease/possible obstructive uropathy: Patient declines hemodialysis. No urgent indication for dialysis. Consider outpatient nephrology consultation and followup. After IV Lasix was initiated, patient's creatinine gradually crept up from 3.15 (probably baseline) to 4.02. Diuretics were held. Renal ultrasound and noncontrasted CT abdomen and pelvis showed persistent bilateral mild to moderate hydronephrosis and progression of bilateral renal masses. Foley catheter placed. Creatinine has slightly improved-most likely from holding diuretics. Await urology followup-? Nephrostomies. Please see discussion below regarding renal masses. 4. Type II DM with renal complications/Hypoglycemia: Likely due to poor excretion of prior PO meds and worsening renal failure. DC oral hypoglycemics. DC SSI. Hypoglycemia protocol.  CBGs have stabilized. Monitor of oral hypoglycemics. 5. Acute on possibly chronic hypoxic respiratory failure: Secondary to decompensated CHF and interstitial lung disease. Will likely need home oxygen on discharge. 6. Hypertension: Continue atenolol. 7. Bilateral renal masses: Discussed with Dr. Alen Blew, Oncologist: Prior biopsies of renal masses were nondiagnostic and patient was managed conservatively with observation. He states that the only way to confirm would be by laparotomy and open biopsies which patient may not tolerate secondary to advanced age, multiple medical problems and patient may not want this. He recommends outpatient followup with him in the clinic after discharge. 8. CAD: Asymptomatic of chest pain.    Code Status: DNR Family Communication: [name] Flynn discussed with patient's daughter at bedside after patient's consent. Disposition Plan: ? Patient lived in independent living prior to admission. Return at discharge versus higher level of care   Consultants:  Urology  Discussed with medical oncology.  Procedures:  Foley catheter 1/11 >  Antibiotics:  None   Subjective: Denies dyspnea. Denies any other complaints.  Objective: Filed Vitals:   08/29/13 0643 08/29/13 1347 08/29/13 2034 08/30/13 0500  BP: 140/66 137/58 147/60 135/70  Pulse: 62 61 71 77  Temp: 97.3 F (36.3 C) 97.6 F (36.4 C) 98.3 F (36.8 C) 98.7 F (37.1 C)  TempSrc: Oral Oral Oral Oral  Resp: 20 18 19 20   Height:      Weight:    70.58 kg (155 lb 9.6 oz)  SpO2: 98% 100% 99% 100%    Intake/Output Summary (Last 24 hours) at 08/30/13 1132 Last data filed at 08/30/13 0900  Gross per 24 hour  Intake    600 ml  Output   1975 ml  Net  -1375 ml   Filed Weights   08/28/13 0628 08/29/13 0642 08/30/13 0500  Weight: 71.1 kg (156 lb 12 oz) 70.9 kg (156 lb 4.9 oz) 70.58  kg (155 lb 9.6 oz)     Exam:  General exam: Sitting up comfortably in reclining chair. Respiratory system: Few  Velcro-like/chronic appearing basal crackles. Rest of lung fields clear to auscultation. No increased work of breathing. Cardiovascular system: S1 & S2 heard, RRR. No JVD, murmurs, gallops, clicks or pedal edema.  Gastrointestinal system: Abdomen is nondistended, soft and nontender. Normal bowel sounds heard. Central nervous system: Alert and oriented. No focal neurological deficits. Extremities: Symmetric 5 x 5 power.   Data Reviewed: Basic Metabolic Panel:  Recent Labs Lab 08/26/13 0447 08/27/13 0344 08/28/13 0433 08/29/13 0523 08/30/13 0550  NA 139 132* 131* 137 134*  K 4.2 4.7 4.2 4.4 5.1  CL 103 97 95* 102 100  CO2 19 20 21 21 19   GLUCOSE 83 106* 140* 119* 94  BUN 48* 56* 61* 61* 63*  CREATININE 3.15* 3.56* 3.93* 4.02* 3.73*  CALCIUM 9.0 8.6 8.3* 8.3* 8.8   Liver Function Tests:  Recent Labs Lab 08/25/13 1005  AST 14  ALT 15  ALKPHOS 68  BILITOT 0.3  PROT 7.6  ALBUMIN 2.9*   No results found for this basename: LIPASE, AMYLASE,  in the last 168 hours No results found for this basename: AMMONIA,  in the last 168 hours CBC:  Recent Labs Lab 08/25/13 1130 08/25/13 2103 08/26/13 0447 08/27/13 0344 08/28/13 0433 08/29/13 0523  WBC 8.7  --  11.3* 9.3 10.7* 10.0  HGB 6.4*  6.4* 9.3* 10.1* 9.0* 8.6* 8.9*  HCT 19.6*  19.5* 28.3* 31.0* 26.3* 26.2* 27.5*  MCV 104.3*  --  100.0 97.4 98.9 99.6  PLT 207  --  237 191 181 199   Cardiac Enzymes: No results found for this basename: CKTOTAL, CKMB, CKMBINDEX, TROPONINI,  in the last 168 hours BNP (last 3 results) No results found for this basename: PROBNP,  in the last 8760 hours CBG:  Recent Labs Lab 08/29/13 0856 08/29/13 1234 08/29/13 1637 08/29/13 2037 08/30/13 0800  GLUCAP 98 115* 129* 161* 87    Recent Results (from the past 240 hour(s))  MRSA PCR SCREENING     Status: None   Collection Time    08/26/13  5:10 AM      Result Value Range Status   MRSA by PCR NEGATIVE  NEGATIVE Final   Comment:             The GeneXpert MRSA Assay (FDA     approved for NASAL specimens     only), is one component of a     comprehensive MRSA colonization     surveillance program. It is not     intended to diagnose MRSA     infection nor to guide or     monitor treatment for     MRSA infections.      Studies: Ct Abdomen Pelvis Wo Contrast  08/29/2013   CLINICAL DATA:  Perirenal masses, hydronephrosis on ultrasound, evaluate for obstructive uropathy  EXAM: CT ABDOMEN AND PELVIS WITHOUT CONTRAST  TECHNIQUE: Multidetector CT imaging of the abdomen and pelvis was performed following the standard protocol without intravenous contrast.  COMPARISON:  Renal ultrasound dated 08/28/2013. CT abdomen pelvis dated 12/01/2012.  FINDINGS: Subpleural reticulation/fibrosis at the lung bases. Trace pleural fluid bilaterally.  Unenhanced liver, spleen, and adrenal glands are within normal limits.  Pancreatic atrophy. Coarse calcifications along the pancreatic head/uncinate process, likely sequela of prior/chronic pancreatitis.  Status post cholecystectomy. No intrahepatic or extrahepatic ductal dilatation.  Kidneys are notable for bilateral hyperdense renal masses,  including:  --4.5 x 5.2 cm irregular mass in the right renal sinus (series 2/ image 39), previously approximately 3.7 x 5.2 cm  --4.8 x 5.1 cm irregular mass along the posterior right upper kidney (series 2/ image 34), previously approximately 3.5 x 5.7 cm  --5.4 x 6.4 cm irregular mass along the right lower kidney/ renal sinus (series 2/ image 49), previously approximately 5.0 x 5.8 cm  Associated mild irregular dilatation of the bilateral renal collecting systems, unchanged.  No evidence of bowel obstruction. Prior appendectomy. Colonic diverticulosis, without associated inflammatory changes.  Atherosclerotic calcifications of the abdominal aorta and branch vessels. Stable displaced intimal calcification (series 2/image 29), likely related to known chronic dissection or  PAU.  No abdominopelvic ascites.  Mild retroperitoneal/mesenteric lymphadenopathy, including a 1.4 x 1.8 cm left lower mesenteric node (series 2/ image 65), previously 1.2 x 1.5 cm. Again noted is mild nodularity along the bilateral perirenal fascia, for example measuring 10 x 12 mm posteriorly on the left (series 2/ image 47), previously 9 x 12 mm.  Mild prostatomegaly.  Suspected prior TURP.  Bladder is decompressed with indwelling Foley catheter and associated nondependent gas.  Degenerative changes of the visualized thoracolumbar spine. Stable alignment of the lower lumbar spine.  IMPRESSION: Progression of bilateral renal masses, as described above, suspicious for lymphoproliferative disorder such as lymphoma or leukemia.  Mild retroperitoneal/mesenteric lymphadenopathy and perirenal nodularity, stable versus mildly increased.  Mild irregular dilatation of the bilateral renal collecting systems, unchanged.   Electronically Signed   By: Julian Hy M.D.   On: 08/29/2013 13:35        Scheduled Meds: . allopurinol  300 mg Oral QODAY  . aspirin  81 mg Oral q morning - 10a  . atenolol  50 mg Oral q morning - 10a  . atorvastatin  20 mg Oral q1800  . enoxaparin (LOVENOX) injection  30 mg Subcutaneous Q24H  . multivitamin with minerals  1 tablet Oral q morning - 10a  . sodium chloride  3 mL Intravenous Q12H  . tamsulosin  0.4 mg Oral q morning - 10a   Continuous Infusions:    Principal Problem:   Anemia Active Problems:   Hypertension   CAD (coronary artery disease)   Hyperlipemia   DM type 2 causing renal disease   Chronic kidney disease, stage IV (severe)   Acute on chronic diastolic CHF (congestive heart failure)   Diabetic hypoglycemia   Acute respiratory failure with hypoxia   Renal failure, acute on chronic    Time spent: 45 minutes.    Vernell Leep, MD, FACP, FHM. [facility] Pager [phone]  If 7PM-7AM, please contact  night-coverage [email or web address] Password [email or web address] 08/30/2013, 11:32 AM    LOS: 5 days

## 2013-08-31 LAB — RENAL FUNCTION PANEL
ALBUMIN: 2.5 g/dL — AB (ref 3.5–5.2)
BUN: 71 mg/dL — AB (ref 6–23)
CHLORIDE: 102 meq/L (ref 96–112)
CO2: 19 mEq/L (ref 19–32)
Calcium: 8.4 mg/dL (ref 8.4–10.5)
Creatinine, Ser: 3.92 mg/dL — ABNORMAL HIGH (ref 0.50–1.35)
GFR, EST AFRICAN AMERICAN: 15 mL/min — AB (ref >90)
GFR, EST NON AFRICAN AMERICAN: 13 mL/min — AB (ref >90)
Glucose, Bld: 135 mg/dL — ABNORMAL HIGH (ref 70–99)
Phosphorus: 5.5 mg/dL — ABNORMAL HIGH (ref 2.3–4.6)
Potassium: 5.3 mEq/L (ref 3.7–5.3)
SODIUM: 136 meq/L — AB (ref 137–147)

## 2013-08-31 LAB — GLUCOSE, CAPILLARY
Glucose-Capillary: 116 mg/dL — ABNORMAL HIGH (ref 70–99)
Glucose-Capillary: 124 mg/dL — ABNORMAL HIGH (ref 70–99)
Glucose-Capillary: 138 mg/dL — ABNORMAL HIGH (ref 70–99)
Glucose-Capillary: 152 mg/dL — ABNORMAL HIGH (ref 70–99)

## 2013-08-31 NOTE — Progress Notes (Signed)
[facility] PROGRESS NOTE    [name] Flynn [id number]:[id number] DOB: [birth year] DOA: 08/25/2013 PCP: Criselda Peaches, MD  HPI/Brief narrative 80 y.o. male , resident of [facility], PMH of DM 2 with renal complications, HTN, CAD, hyperlipidemia, stage IV chronic kidney disease, left perihilar renal mass followed by oncology, RBBB, was referred by his PCPs office for evaluation and management of anemia. Patient presented with generalized weakness. Anemia stable after 2 units PRBCs. Treated with brief IV Lasix for acute diastolic CHF. Creatinine increased and Lasix was held. Urology consulted for bilateral hydronephrosis-managing conservatively.   Assessment/Plan:  1. 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 Rectal exam by EDP showed brown stools and was FOBT negative. Improved s/p 2 PRBC's. OP follow up with oncology. Stable 2. Acute on possibly Chronic Diastolic CHF: LVEF 06-26%, grade 2 diastolic dysfunction and moderate pulmonary hypertension. Patient was initially placed on IV Lasix and his creatinine rose to peak of 4.02. Lasix held. Clinically not overtly volume overloaded at this time. We'll use Lasix when necessary. 3. Acute on Stage IV chronic kidney disease/possible obstructive uropathy: Patient declines hemodialysis. No urgent indication for dialysis. Consider outpatient nephrology consultation and followup. After IV Lasix was initiated, patient's creatinine gradually crept up from 3.15 (probably baseline) to 4.02. Diuretics were held. Renal ultrasound and noncontrasted CT abdomen and pelvis showed persistent bilateral mild to moderate hydronephrosis and progression of bilateral renal masses. Foley catheter placed. Creatinine seemed to have improved yesterday but has again gone up to 3.9. Urology followup appreciated-discussed options with  patient regarding renal drainage versus continued observation. I discussed with patient today and he does not want any aggressive intervention such as nephrostomies, hemodialysis, biopsies or surgery. After discussing with Dr. Alinda Money, Foley catheter discontinued and we'll encourage increased oral fluid intake. Follow up BMP in a.m. and if creatinine is improving or stabilized, DC home with close outpatient followup with labs. 4. Type II DM with renal complications/Hypoglycemia: Likely due to poor excretion of prior PO meds and worsening renal failure. DC oral hypoglycemics. DC SSI. Hypoglycemia protocol. CBGs have stabilized.  DC home off of all hypoglycemics but continue to closely monitor CBCs. CBGs ranging between 87-197.  5. Acute on possibly chronic hypoxic respiratory failure: Secondary to decompensated CHF and interstitial lung disease. Will likely need home oxygen on discharge. 6. Hypertension: Continue atenolol. 7. Bilateral renal masses: Discussed with Dr. Alen Blew, Oncologist on 1/12 : Prior biopsies of renal masses were nondiagnostic and patient was managed conservatively with observation. He states that the only way to confirm would be by laparotomy and open biopsies which patient may not tolerate secondary to advanced age, multiple medical problems and patient may not want this. He recommends outpatient followup with him in the clinic after discharge. 8. CAD: Asymptomatic of chest pain.    Code Status: DNR Family Communication:  none at bedside  Disposition Plan:  DC to independent living when medically stable, with home health services, home O2, RN for lab draws (CBC and BMP) and outpatient followup with PCP, oncology and nephrology consultation through PCP.   Consultants:  Urology  Discussed with medical oncology.  Procedures:  Foley catheter 1/11 > 1/13  Antibiotics:  None   Subjective:  denies complaints.   Objective: Filed Vitals:   08/30/13 1401 08/30/13 2300 08/31/13  0500 08/31/13 1418  BP: 130/64 129/65 123/68 126/66  Pulse: 63 70 77 69  Temp: 98.3 F (  36.8 C) 98.5 F (36.9 C) 98.7 F (37.1 C) 98.3 F (36.8 C)  TempSrc: Oral Oral Oral Oral  Resp: 18 20 22 20   Height:      Weight:   69.945 kg (154 lb 3.2 oz)   SpO2: 99% 100% 100% 99%    Intake/Output Summary (Last 24 hours) at 08/31/13 1547 Last data filed at 08/31/13 1419  Gross per 24 hour  Intake    720 ml  Output   1850 ml  Net  -1130 ml   Filed Weights   08/29/13 0642 08/30/13 0500 08/31/13 0500  Weight: 70.9 kg (156 lb 4.9 oz) 70.58 kg (155 lb 9.6 oz) 69.945 kg (154 lb 3.2 oz)     Exam:  General exam:  lying comfortably in bed.  Respiratory system: Few Velcro-like/chronic appearing basal crackles. Rest of lung fields clear to auscultation. No increased work of breathing. Cardiovascular system: S1 & S2 heard, RRR. No JVD, murmurs, gallops, clicks or pedal edema.  Gastrointestinal system: Abdomen is nondistended, soft and nontender. Normal bowel sounds heard. Foleys +  Central nervous system: Alert and oriented. No focal neurological deficits. Extremities: Symmetric 5 x 5 power.   Data Reviewed: Basic Metabolic Panel:  Recent Labs Lab 08/27/13 0344 08/28/13 0433 08/29/13 0523 08/30/13 0550 08/31/13 0454  NA 132* 131* 137 134* 136*  K 4.7 4.2 4.4 5.1 5.3  CL 97 95* 102 100 102  CO2 20 21 21 19 19   GLUCOSE 106* 140* 119* 94 135*  BUN 56* 61* 61* 63* 71*  CREATININE 3.56* 3.93* 4.02* 3.73* 3.92*  CALCIUM 8.6 8.3* 8.3* 8.8 8.4  PHOS  --   --   --   --  5.5*   Liver Function Tests:  Recent Labs Lab 08/25/13 1005 08/31/13 0454  AST 14  --   ALT 15  --   ALKPHOS 68  --   BILITOT 0.3  --   PROT 7.6  --   ALBUMIN 2.9* 2.5*   No results found for this basename: LIPASE, AMYLASE,  in the last 168 hours No results found for this basename: AMMONIA,  in the last 168 hours CBC:  Recent Labs Lab 08/25/13 1130 08/25/13 2103 08/26/13 0447 08/27/13 0344  08/28/13 0433 08/29/13 0523  WBC 8.7  --  11.3* 9.3 10.7* 10.0  HGB 6.4*  6.4* 9.3* 10.1* 9.0* 8.6* 8.9*  HCT 19.6*  19.5* 28.3* 31.0* 26.3* 26.2* 27.5*  MCV 104.3*  --  100.0 97.4 98.9 99.6  PLT 207  --  237 191 181 199   Cardiac Enzymes: No results found for this basename: CKTOTAL, CKMB, CKMBINDEX, TROPONINI,  in the last 168 hours BNP (last 3 results) No results found for this basename: PROBNP,  in the last 8760 hours CBG:  Recent Labs Lab 08/30/13 1159 08/30/13 1641 08/30/13 2227 08/31/13 0746 08/31/13 1200  GLUCAP 142* 143* 197* 116* 124*    Recent Results (from the past 240 hour(s))  MRSA PCR SCREENING     Status: None   Collection Time    08/26/13  5:10 AM      Result Value Range Status   MRSA by PCR NEGATIVE  NEGATIVE Final   Comment:            The GeneXpert MRSA Assay (FDA     approved for NASAL specimens     only), is one component of a     comprehensive MRSA colonization     surveillance program. It is not  intended to diagnose MRSA     infection nor to guide or     monitor treatment for     MRSA infections.      Studies: No results found.      Scheduled Meds: . allopurinol  300 mg Oral QODAY  . aspirin  81 mg Oral q morning - 10a  . atenolol  50 mg Oral q morning - 10a  . atorvastatin  20 mg Oral q1800  . enoxaparin (LOVENOX) injection  30 mg Subcutaneous Q24H  . multivitamin with minerals  1 tablet Oral q morning - 10a  . sodium chloride  3 mL Intravenous Q12H  . tamsulosin  0.4 mg Oral q morning - 10a   Continuous Infusions:    Principal Problem:   Anemia Active Problems:   Hypertension   CAD (coronary artery disease)   Hyperlipemia   DM type 2 causing renal disease   Chronic kidney disease, stage IV (severe)   Acute on chronic diastolic CHF (congestive heart failure)   Diabetic hypoglycemia   Acute respiratory failure with hypoxia   Renal failure, acute on chronic    Time spent: 25 minutes.    Vernell Leep, MD,  FACP, FHM. [facility] Pager [phone]  If 7PM-7AM, please contact night-coverage [email or web address] Password [email or web address] 08/31/2013, 3:47 PM    LOS: 6 days

## 2013-08-31 NOTE — Progress Notes (Signed)
Patient ID: [name] Flynn, male   DOB: Feb 13, 1924, 80 y.o.   MRN: [id number]    Subjective: Pt without flank pain or new symptoms.  Objective: Vital signs in last 24 hours: Temp:  [98.3 F (36.8 C)-98.7 F (37.1 C)] 98.7 F (37.1 C) (01/13 0500) Pulse Rate:  [63-77] 77 (01/13 0500) Resp:  [18-22] 22 (01/13 0500) BP: (123-130)/(64-68) 123/68 mmHg (01/13 0500) SpO2:  [99 %-100 %] 100 % (01/13 0500) Weight:  [69.945 kg (154 lb 3.2 oz)] 69.945 kg (154 lb 3.2 oz) (01/13 0500)  Intake/Output from previous day: 01/12 0701 - 01/13 0700 In: 720 [P.O.:720] Out: 1450 [Urine:1450] Intake/Output this shift:    Physical Exam:  General: Alert and oriented Abdomen: Soft, ND, No CVAT   Lab Results:  Recent Labs  08/29/13 0523  HGB 8.9*  HCT 27.5*   BMET  Recent Labs  08/30/13 0550 08/31/13 0454  NA 134* 136*  K 5.1 5.3  CL 100 102  CO2 19 19  GLUCOSE 94 135*  BUN 63* 71*  CREATININE 3.73* 3.92*  CALCIUM 8.8 8.4     Studies/Results:   Assessment/Plan: 1) Bilateral hydronephrosis/AKI with history of CKD (baseline Cr 3.2 - Oct 2014): Renal function is slightly worse today.  I again discussed options with Dr. Georga Kaufmann including renal drainage vs continued observation.  This decision depends on patient's wishes and discussion with Dr. Alen Blew as to how aggressive he wishes to be with treatment considering his oncologic situation and advanced age. Currently, we have agreed to continue to monitor his renal function and recheck tomorrow.  If renal function is improving or stabilizing by tomorrow, the other option will be close follow up with Dr. Alen Blew and Nephrology with decision to be made as an outpatient.   LOS: 6 days   Guenevere Roorda,LES 08/31/2013, 7:41 AM

## 2013-08-31 NOTE — Progress Notes (Signed)
Physical Therapy Treatment Patient Details Name: [name] Flynn MRN: [id number] DOB: 10-22-1923 Today's Date: 08/31/2013 Time: 4944-9675 PT Time Calculation (min): 15 min  PT Assessment / Plan / Recommendation  History of Present Illness 80 yo male admitted with anemia, weakness. Hx of DM, HTN. Pt is from [place].    PT Comments   Progressing with mobility.  Follow Up Recommendations  Home health PT     Does the patient have the potential to tolerate intense rehabilitation     Barriers to Discharge        Equipment Recommendations  None recommended by PT    Recommendations for Other Services OT consult  Frequency Min 3X/week   Progress towards PT Goals Progress towards PT goals: Progressing toward goals  Plan Current plan remains appropriate    Precautions / Restrictions Precautions Precautions: Fall Restrictions Weight Bearing Restrictions: No   Pertinent Vitals/Pain Ambulated on 2L O2    Mobility  Transfers Overall transfer level: Modified independent Ambulation/Gait Ambulation/Gait assistance: Supervision Ambulation Distance (Feet): 500 Feet Assistive device: Rolling walker (2 wheeled) General Gait Details: slow gait speed. Dyspnea 2/4 with ambulation. close guard for safety. VCs pursed lip breathing    Exercises     PT Diagnosis:    PT Problem List:   PT Treatment Interventions:     PT Goals (current goals can now be found in the care plan section)    Visit Information  Last PT Received On: 08/31/13 Assistance Needed: +1 History of Present Illness: 80 yo male admitted with anemia, weakness. Hx of DM, HTN. Pt is from [place].     Subjective Data      Cognition  Cognition Arousal/Alertness: Awake/alert Behavior During Therapy: WFL for tasks assessed/performed Overall Cognitive Status: Within Functional Limits for tasks assessed    Balance     End of Session PT - End of Session Equipment Utilized During  Treatment: Oxygen;Gait belt Activity Tolerance: Patient tolerated treatment well Patient left: in chair;with call bell/phone within reach   GP     [name], MPT Pager: [phone]

## 2013-09-01 DIAGNOSIS — R5383 Other fatigue: Secondary | ICD-10-CM

## 2013-09-01 DIAGNOSIS — R5381 Other malaise: Secondary | ICD-10-CM

## 2013-09-01 DIAGNOSIS — E785 Hyperlipidemia, unspecified: Secondary | ICD-10-CM

## 2013-09-01 LAB — RENAL FUNCTION PANEL
Albumin: 2.6 g/dL — ABNORMAL LOW (ref 3.5–5.2)
BUN: 68 mg/dL — AB (ref 6–23)
CALCIUM: 8.6 mg/dL (ref 8.4–10.5)
CO2: 19 mEq/L (ref 19–32)
Chloride: 101 mEq/L (ref 96–112)
Creatinine, Ser: 3.98 mg/dL — ABNORMAL HIGH (ref 0.50–1.35)
GFR calc Af Amer: 14 mL/min — ABNORMAL LOW (ref >90)
GFR calc non Af Amer: 12 mL/min — ABNORMAL LOW (ref >90)
GLUCOSE: 118 mg/dL — AB (ref 70–99)
Phosphorus: 4.9 mg/dL — ABNORMAL HIGH (ref 2.3–4.6)
Potassium: 5.1 mEq/L (ref 3.7–5.3)
SODIUM: 136 meq/L — AB (ref 137–147)

## 2013-09-01 LAB — GLUCOSE, CAPILLARY
GLUCOSE-CAPILLARY: 124 mg/dL — AB (ref 70–99)
Glucose-Capillary: 123 mg/dL — ABNORMAL HIGH (ref 70–99)

## 2013-09-01 LAB — CBC
HCT: 26.6 % — ABNORMAL LOW (ref 39.0–52.0)
HEMOGLOBIN: 8.6 g/dL — AB (ref 13.0–17.0)
MCH: 32.2 pg (ref 26.0–34.0)
MCHC: 32.3 g/dL (ref 30.0–36.0)
MCV: 99.6 fL (ref 78.0–100.0)
Platelets: 218 10*3/uL (ref 150–400)
RBC: 2.67 MIL/uL — ABNORMAL LOW (ref 4.22–5.81)
RDW: 16.4 % — AB (ref 11.5–15.5)
WBC: 11.3 10*3/uL — ABNORMAL HIGH (ref 4.0–10.5)

## 2013-09-01 NOTE — Progress Notes (Signed)
Spoke with pt concerning Home Health, pt selected [facility] for DME, [facility] for [facility]. A call to [facility] to Cornell Barman, RN revealed that they will set HHPT up for pt if that is what he wants.

## 2013-09-01 NOTE — Discharge Summary (Signed)
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  -Rectal exam by EDP showed brown stools and was FOBT negative. -Improved s/p 2 PRBC's. OP follow up with oncology.   Acute on possibly Chronic Diastolic CHF: - LVEF 32-99%, grade 2 diastolic dysfunction and moderate pulmonary hypertension. Patient was initially placed on IV Lasix and his creatinine rose to peak of 4.02. Lasix held. Current Cr= 3.98  Acute on Stage IV chronic kidney disease/possible obstructive uropathy:  -Patient declines hemodialysis. - No urgent indication for dialysis. - bilateral mild to moderate hydronephrosis and progression of bilateral renal masses. Patient has declined allergies offer for invasive treatment. I reviewed urology recommendations and patient again declined offer of invasive treatment.  -Followup as outpatient with urology  -Followup outpatient nephrology  Type II DM with renal complications/Hypoglycemia:  -Currently patient's renal function would prohibit any diabetic medication other than insulin  -Will discharge patient on no medication with recommendation to follow up closely with PCP who will establish insulin regimen if required.  -Currently patient's CBGs within normal limits  Acute hypoxic respiratory failure:  -Secondary to decompensated CHF and interstitial lung disease.  -Will need home oxygen on discharge.  Ambulatory SpO2 on 08/30/2013  SATURATION QUALIFICATIONS: (This note is used to comply with regulatory documentation for home oxygen)   Patient Saturations on Room Air at Rest = 93%   Patient Saturations on Room Air  while Ambulating = 76%  Patient Saturations on 2-3 Liters of oxygen while Ambulating = 84%   Hypertension:  -Continue atenolol.  Bilateral renal masses: - Dr. Algis Liming, Discussed with Dr. Alen Blew, Oncologist on 1/12 : Prior biopsies of renal masses were nondiagnostic patient wishes no aggressive measures be taken at this time. -Urology note from 09/01/2013 verifies patient wishes no aggressive measures to be taken at this time -Patient confirmed to me that he wished no further aggressive measures be taken at this time.request to be discharged. Followup with urology as outpatient Dr. Dutch Gray  CAD: Asymptomatic of chest pain.      Discharge Diagnoses:  Principal Problem:   Anemia Active Problems:   Hypertension   CAD (coronary artery disease)   Hyperlipemia   DM type 2 causing renal disease   Chronic kidney disease, stage IV (severe)   Acute on chronic diastolic CHF (congestive heart failure)   Diabetic hypoglycemia   Acute respiratory failure with hypoxia   Renal failure, acute on chronic   Discharge Condition: Stable  Diet recommendation: Heart healthy  Filed Weights   08/30/13 0500 08/31/13 0500 09/01/13 0547  Weight: 70.58 kg (155 lb 9.6 oz) 69.945 kg (154 lb 3.2 oz) 71.2 kg (156 lb 15.5 oz)    History of present illness:  80 y.o.WM  resident of [facility], PMHx DM 2 with renal complications, HTN, CAD, hyperlipidemia, stage IV chronic kidney disease, left perihilar renal mass with biopsy which was nondiagnostic, followed by oncology, RBBB, was referred by his PCPs office for evaluation and management of anemia. Patient presented with generalized weakness. Patient states that he first noticed gradual onset of weakness approximately 6 months ago when he noticed easy tiring while walking the same distance as before and he had to take a couple of rest stops.  These symptoms have progressively gotten worse. Now even walking a short distance and sometimes at rest he feels  easily tired and short of breath. He denies cough, chest pain, orthopnea or PND. He denies leg swelling. Will also has intermittent dizziness and lightheadedness with activity but has not passed out or fallen. He denies bleeding, vomiting or melena. Last colonoscopy greater than 5 years ago was said to be normal and GI M.D. advised him that he didn't need any more screening colonoscopy due to his 80 advanced age. He went for a routine visit to his PCPs office yesterday and was called back today to advise him that his hemoglobin was 6.1 g and he had to come to the ED. In the ED, hemoglobin 6.4 creatinine 3.14 MCV 104.3. EDP is started transfusing with 2 units of PRBCs. In addition patient's creatinine was found to be 4.0. As well as chronic diastolic CHF with pulmonary hypertension. Urology was consulted Dr. Dutch Gray, discussed with patient that he has bilateral hydronephrosis/ureteral structure in with AKI/CKD. Dr. Alinda Money counseled patient that etiology is likely multifactorial but ureteral obstruction is probably at least a contributing component. I again reviewed options with Dr. Georga Kaufmann including continued observation with close outpatient follow up with nephrology and Dr. Alen Blew versus proceeding with intervention such as cystoscopy and ureteral stent placement. We reviewed the pros and cons of each approach. Dr. Georga Kaufmann expressed his clear understanding of the options and understands that his renal dysfunction may be progressive without intervention. At this time, he wishes to avoid any intervention to relieve obstruction of his kidneys. 09/01/2013, patient states he understands the urology recommendations, and declines at this time. Negative abdominal pain, negative CP, negative SOB on 2 L [place]. States prior to this hospitalization did not use home O2.   Procedures: Echocardiogram 08/27/2013 - Left ventricle: mild LVH.  -LVEF=  60% to 65%. Wall motion was normal; there were no regional wall motion  abnormalities.  - (grade 2 diastolic dysfunction). - Ventricular septum: D-shaped interventricular septum suggests RV pressure/volume overload. - Aortic valve: There was no stenosis. Mild regurgitation. - Mitral valve: Mildly calcified annulus. Mildly calcified leaflets . Trivial regurgitation. - Left atrium: The atrium was mildly dilated. - Right ventricle: The cavity size was mildly to moderately dilated. Systolic function was normal. - Right atrium: The atrium was mildly dilated. - Tricuspid valve: Peak RV-RA gradient: 57mm Hg (S). - Pulmonary arteries: PA peak pressure: 74mm Hg (S).     Consultations: Dr. Dutch Gray (urology)     Discharge Exam: Filed Vitals:   08/31/13 0500 08/31/13 1418 08/31/13 2228 09/01/13 0547  BP: 123/68 126/66 138/70 140/72  Pulse: 77 69 75 65  Temp: 98.7 F (37.1 C) 98.3 F (36.8 C) 98.1 F (36.7 C) 97.7 F (36.5 C)  TempSrc: Oral Oral Oral Oral  Resp: 22 20 20 20   Height:      Weight: 69.945 kg (154 lb 3.2 oz)   71.2 kg (156 lb 15.5 oz)  SpO2: 100% 99% 97% 98%    General: A./O. x4, NAD Cardiovascular: Regular rhythm and rate, negative murmurs rubs gallops, DP/PT pulse plus Respiratory: Clear to auscultation bilateral Musculoskeletal; negative abdominal tenderness to palpation, negative CVA tenderness to palpation   Discharge Instructions   Future Appointments Provider Department Dept Phone   12/07/2013 9:30 AM [facility] [phone]   12/07/2013 10:30 AM [facility] [phone]   Liquids only 4 hours prior to your exam.  Any medications can be taken as usual. Please arrive 15 min prior to your scheduled exam time.   12/10/2013 10:00 AM Wyatt Portela, MD [facility] [phone]       Medication List    ASK your doctor about these medications       allopurinol 300 MG tablet  Commonly known as:  ZYLOPRIM  Take 300  mg by mouth every other day.     aspirin 81 MG tablet  Take 81 mg by mouth every morning.     atenolol 50 MG tablet  Commonly known as:  TENORMIN  Take 50 mg by mouth every morning.     glimepiride 2 MG tablet  Commonly known as:  AMARYL  Take 2 mg by mouth daily before breakfast.     multivitamin tablet  Take 1 tablet by mouth every morning.     rosuvastatin 10 MG tablet  Commonly known as:  CRESTOR  Take 10 mg by mouth every other day.     tamsulosin 0.4 MG Caps capsule  Commonly known as:  FLOMAX  Take 0.4 mg by mouth every morning.       No Known Allergies    The results of significant diagnostics from this hospitalization (including imaging, microbiology, ancillary and laboratory) are listed below for reference.    Significant Diagnostic Studies: Ct Abdomen Pelvis Wo Contrast  08/29/2013   CLINICAL DATA:  Perirenal masses, hydronephrosis on ultrasound, evaluate for obstructive uropathy  EXAM: CT ABDOMEN AND PELVIS WITHOUT CONTRAST  TECHNIQUE: Multidetector CT imaging of the abdomen and pelvis was performed following the standard protocol without intravenous contrast.  COMPARISON:  Renal ultrasound dated 08/28/2013. CT abdomen pelvis dated 12/01/2012.  FINDINGS: Subpleural reticulation/fibrosis at the lung bases. Trace pleural fluid bilaterally.  Unenhanced liver, spleen, and adrenal glands are within normal limits.  Pancreatic atrophy. Coarse calcifications along the pancreatic head/uncinate process, likely sequela of prior/chronic pancreatitis.  Status post cholecystectomy. No intrahepatic or extrahepatic ductal dilatation.  Kidneys are notable for bilateral hyperdense renal masses, including:  --4.5 x 5.2 cm irregular mass in the right renal sinus (series 2/ image 39), previously approximately 3.7 x 5.2 cm  --4.8 x 5.1 cm irregular mass along the posterior right upper kidney (series 2/ image 34), previously approximately 3.5 x 5.7 cm  --5.4 x 6.4 cm irregular mass along the  right lower kidney/ renal sinus (series 2/ image 49), previously approximately 5.0 x 5.8 cm  Associated mild irregular dilatation of the bilateral renal collecting systems, unchanged.  No evidence of bowel obstruction. Prior appendectomy. Colonic diverticulosis, without associated inflammatory changes.  Atherosclerotic calcifications of the abdominal aorta and branch vessels. Stable displaced intimal calcification (series 2/image 29), likely related to known chronic dissection or PAU.  No abdominopelvic ascites.  Mild retroperitoneal/mesenteric lymphadenopathy, including a 1.4 x 1.8 cm left lower mesenteric node (series 2/ image 65), previously 1.2 x 1.5 cm. Again noted is mild nodularity along the bilateral perirenal fascia, for example measuring 10 x 12 mm posteriorly on the left (series 2/ image 47), previously 9 x 12 mm.  Mild prostatomegaly.  Suspected prior TURP.  Bladder is decompressed with indwelling Foley catheter and associated nondependent gas.  Degenerative changes of the visualized thoracolumbar spine. Stable alignment of the lower lumbar spine.  IMPRESSION: Progression of bilateral renal masses, as described above, suspicious for lymphoproliferative disorder such as lymphoma or leukemia.  Mild retroperitoneal/mesenteric lymphadenopathy and perirenal nodularity, stable versus mildly increased.  Mild irregular dilatation of the  bilateral renal collecting systems, unchanged.   Electronically Signed   By: Julian Hy M.D.   On: 08/29/2013 13:35   Dg Chest 2 View  08/27/2013   CLINICAL DATA:  Congestive failure, hypertension  EXAM: CHEST  2 VIEW  COMPARISON:  DG CHEST 2 VIEW dated 08/25/2013; DG CHEST 2 VIEW dated 03/07/2012; CT CHEST W/O CM dated 12/01/2012  FINDINGS: There is bilateral diffuse interstitial thickening likely chronic. The degree of interstitial prominence is greater then compared with 03/07/2012. Mild superimposed interstitial edema or infection cannot be completely excluded. There is  no focal consolidation, pleural effusion or pneumothorax. There is stable cardiomegaly. The osseous structures are unremarkable.  IMPRESSION: Chronic bilateral interstitial disease. The interstitial prominence is greater compared with 03/07/2012 concerning for possible superimposed interstitial edema or infection.   Electronically Signed   By: Kathreen Devoid   On: 08/27/2013 08:30   Dg Chest 2 View  08/25/2013   CLINICAL DATA:  Short of breath  EXAM: CHEST  2 VIEW  COMPARISON:  CT CHEST W/O CM dated 12/01/2012; DG CHEST 2 VIEW dated 03/07/2012  FINDINGS: Cardiac silhouette is enlarged but unchanged. There is new patchy bilateral airspace disease. This is superimposed on chronic interstitial lung disease. These findings appear progressed compared to prior CT and chest radiograph. No pleural fluid.  IMPRESSION: 1. Interval increase in bilateral patchy airspace disease suggests pulmonary edema or less likely multifocal pneumonia superimposed on chronic interstitial lung disease. Recommend followup radiographs to ensure resolution and exclude a malignant nodularity. 2. Cardiomegaly is similar prior.   Electronically Signed   By: Suzy Bouchard M.D.   On: 08/25/2013 14:02   US Renal  08/28/2013   CLINICAL DATA:  Chronic kidney disease, evaluate for hydronephrosis.  EXAM: RENAL/URINARY TRACT ULTRASOUND COMPLETE  COMPARISON:  12/08/2012, 12/01/2012  FINDINGS: Right Kidney:  Length: 10.9 cm length. Moderate diffuse hydronephrosis. Normal cortical echogenicity. Small incidental right upper pole cortical cyst measures 8 mm. Ill-defined hypoechoic right inferior pole solid area measures 5.0 x 3.6 x 4.2 cm compatible with a known perinephric right renal mass.  Left Kidney:  Length: 11.9 cm. Mild to moderate hydronephrosis. Slight increased echogenicity of the renal parenchyma. 2 large solid areas noted in the left kidney in the midpole measures 5.5 x 4.9 x 4.8 cm. Irregular left inferior pole solid mass measures 4.2 x 3.6 x  3.9 cm.  Bladder:  Grossly unremarkable by ultrasound. Right ureteral jet demonstrated. Left ureteral jet not seen.  IMPRESSION: Mild to moderate bilateral hydronephrosis.  Bilateral known solid hypoechoic renal/perirenal masses   Electronically Signed   By: Daryll Brod M.D.   On: 08/28/2013 13:05    Microbiology: Recent Results (from the past 240 hour(s))  MRSA PCR SCREENING     Status: None   Collection Time    08/26/13  5:10 AM      Result Value Range Status   MRSA by PCR NEGATIVE  NEGATIVE Final   Comment:            The GeneXpert MRSA Assay (FDA     approved for NASAL specimens     only), is one component of a     comprehensive MRSA colonization     surveillance program. It is not     intended to diagnose MRSA     infection nor to guide or     monitor treatment for     MRSA infections.     Labs: Basic Metabolic Panel:  Recent Labs Lab 08/28/13 [phone]  08/29/13 0523 08/30/13 0550 08/31/13 0454 09/01/13 0500  NA 131* 137 134* 136* 136*  K 4.2 4.4 5.1 5.3 5.1  CL 95* 102 100 102 101  CO2 21 21 19 19 19   GLUCOSE 140* 119* 94 135* 118*  BUN 61* 61* 63* 71* 68*  CREATININE 3.93* 4.02* 3.73* 3.92* 3.98*  CALCIUM 8.3* 8.3* 8.8 8.4 8.6  PHOS  --   --   --  5.5* 4.9*   Liver Function Tests:  Recent Labs Lab 08/31/13 0454 09/01/13 0500  ALBUMIN 2.5* 2.6*   No results found for this basename: LIPASE, AMYLASE,  in the last 168 hours No results found for this basename: AMMONIA,  in the last 168 hours CBC:  Recent Labs Lab 08/26/13 0447 08/27/13 0344 08/28/13 0433 08/29/13 0523 09/01/13 0457  WBC 11.3* 9.3 10.7* 10.0 11.3*  HGB 10.1* 9.0* 8.6* 8.9* 8.6*  HCT 31.0* 26.3* 26.2* 27.5* 26.6*  MCV 100.0 97.4 98.9 99.6 99.6  PLT 237 191 181 199 218   Cardiac Enzymes: No results found for this basename: CKTOTAL, CKMB, CKMBINDEX, TROPONINI,  in the last 168 hours BNP: BNP (last 3 results) No results found for this basename: PROBNP,  in the last 8760  hours CBG:  Recent Labs Lab 08/31/13 1200 08/31/13 1707 08/31/13 2225 09/01/13 0753 09/01/13 1158  GLUCAP 124* 138* 152* 123* 124*       Signed:  Dia Crawford, MD [facility] [phone] pager

## 2013-09-01 NOTE — Progress Notes (Signed)
Patient ID: [name] Flynn, male   DOB: March 24, 1924, 80 y.o.   MRN: [id number]    Subjective: Pt continues to deny flank pain.  Catheter removed with decreased urine output since.  Objective: Vital signs in last 24 hours: Temp:  [97.7 F (36.5 C)-98.3 F (36.8 C)] 97.7 F (36.5 C) (01/14 0547) Pulse Rate:  [65-75] 65 (01/14 0547) Resp:  [20] 20 (01/14 0547) BP: (126-140)/(66-72) 140/72 mmHg (01/14 0547) SpO2:  [97 %-99 %] 98 % (01/14 0547) Weight:  [71.2 kg (156 lb 15.5 oz)] 71.2 kg (156 lb 15.5 oz) (01/14 0547)  Intake/Output from previous day: 01/13 0701 - 01/14 0700 In: 480 [P.O.:480] Out: 500 [Urine:500] Intake/Output this shift:    Physical Exam:  General: Alert and oriented Abd: No CVAT  Lab Results:  Recent Labs  09/01/13 0457  HGB 8.6*  HCT 26.6*   BMET  Recent Labs  08/31/13 0454 09/01/13 0500  NA 136* 136*  K 5.3 5.1  CL 102 101  CO2 19 19  GLUCOSE 135* 118*  BUN 71* 68*  CREATININE 3.92* 3.98*  CALCIUM 8.4 8.6     Studies/Results: No results found.  Assessment/Plan: 1) Bilateral hydronephrosis/ureteral obstruction with AKI/CKD (baseline Cr 3.2): [name] Flynn renal function remains stable today although his Cr still remains above baseline.  The etiology is likely multifactorial but ureteral obstruction is probably at least a contributing component. I again reviewed options with [name] Flynn including continued observation with close outpatient follow up with nephrology and [name] Flynn versus proceeding with intervention such as cystoscopy and ureteral stent placement. We reviewed the pros and cons of each approach.  [name] Flynn expressed his clear understanding of the options and understands that his renal dysfunction may be progressive without intervention. At this time, he wishes to avoid any intervention to relieve obstruction of his kidneys.  Therefore, he should followup closely with nephrology and also [name] Flynn and further discuss his  treatment goals with them.  If he elects to proceed with more aggressive treatment in the future, I will certainly be available to help him but he does not require routine urologic follow up at this time unless he makes the decision to be more aggressive about his ureteral obstruction.   LOS: 7 days   Berenise Hunton,LES 09/01/2013, 7:30 AM

## 2013-09-15 ENCOUNTER — Emergency Department (HOSPITAL_COMMUNITY): Payer: Medicare Other

## 2013-09-15 ENCOUNTER — Encounter (HOSPITAL_COMMUNITY): Payer: Self-pay | Admitting: Emergency Medicine

## 2013-09-15 ENCOUNTER — Inpatient Hospital Stay (HOSPITAL_COMMUNITY)
Admission: EM | Admit: 2013-09-15 | Discharge: 2013-09-19 | DRG: 535 | Disposition: E | Payer: Medicare Other | Attending: Internal Medicine | Admitting: Internal Medicine

## 2013-09-15 DIAGNOSIS — S32309A Unspecified fracture of unspecified ilium, initial encounter for closed fracture: Principal | ICD-10-CM | POA: Diagnosis present

## 2013-09-15 DIAGNOSIS — I129 Hypertensive chronic kidney disease with stage 1 through stage 4 chronic kidney disease, or unspecified chronic kidney disease: Secondary | ICD-10-CM | POA: Diagnosis present

## 2013-09-15 DIAGNOSIS — Y92009 Unspecified place in unspecified non-institutional (private) residence as the place of occurrence of the external cause: Secondary | ICD-10-CM

## 2013-09-15 DIAGNOSIS — N179 Acute kidney failure, unspecified: Secondary | ICD-10-CM

## 2013-09-15 DIAGNOSIS — W010XXA Fall on same level from slipping, tripping and stumbling without subsequent striking against object, initial encounter: Secondary | ICD-10-CM | POA: Diagnosis present

## 2013-09-15 DIAGNOSIS — E872 Acidosis, unspecified: Secondary | ICD-10-CM | POA: Diagnosis not present

## 2013-09-15 DIAGNOSIS — Z7982 Long term (current) use of aspirin: Secondary | ICD-10-CM

## 2013-09-15 DIAGNOSIS — S32302A Unspecified fracture of left ilium, initial encounter for closed fracture: Secondary | ICD-10-CM

## 2013-09-15 DIAGNOSIS — B952 Enterococcus as the cause of diseases classified elsewhere: Secondary | ICD-10-CM | POA: Diagnosis present

## 2013-09-15 DIAGNOSIS — G9340 Encephalopathy, unspecified: Secondary | ICD-10-CM | POA: Diagnosis not present

## 2013-09-15 DIAGNOSIS — F0390 Unspecified dementia without behavioral disturbance: Secondary | ICD-10-CM | POA: Diagnosis present

## 2013-09-15 DIAGNOSIS — N184 Chronic kidney disease, stage 4 (severe): Secondary | ICD-10-CM | POA: Diagnosis present

## 2013-09-15 DIAGNOSIS — I2510 Atherosclerotic heart disease of native coronary artery without angina pectoris: Secondary | ICD-10-CM | POA: Diagnosis present

## 2013-09-15 DIAGNOSIS — I469 Cardiac arrest, cause unspecified: Secondary | ICD-10-CM | POA: Diagnosis not present

## 2013-09-15 DIAGNOSIS — I959 Hypotension, unspecified: Secondary | ICD-10-CM | POA: Diagnosis not present

## 2013-09-15 DIAGNOSIS — E875 Hyperkalemia: Secondary | ICD-10-CM | POA: Diagnosis present

## 2013-09-15 DIAGNOSIS — W19XXXA Unspecified fall, initial encounter: Secondary | ICD-10-CM

## 2013-09-15 DIAGNOSIS — J9601 Acute respiratory failure with hypoxia: Secondary | ICD-10-CM

## 2013-09-15 DIAGNOSIS — N390 Urinary tract infection, site not specified: Secondary | ICD-10-CM | POA: Diagnosis present

## 2013-09-15 DIAGNOSIS — E1129 Type 2 diabetes mellitus with other diabetic kidney complication: Secondary | ICD-10-CM | POA: Diagnosis present

## 2013-09-15 DIAGNOSIS — D638 Anemia in other chronic diseases classified elsewhere: Secondary | ICD-10-CM | POA: Diagnosis present

## 2013-09-15 DIAGNOSIS — J9600 Acute respiratory failure, unspecified whether with hypoxia or hypercapnia: Secondary | ICD-10-CM | POA: Diagnosis present

## 2013-09-15 DIAGNOSIS — E119 Type 2 diabetes mellitus without complications: Secondary | ICD-10-CM | POA: Diagnosis present

## 2013-09-15 DIAGNOSIS — R0681 Apnea, not elsewhere classified: Secondary | ICD-10-CM | POA: Diagnosis not present

## 2013-09-15 DIAGNOSIS — I4901 Ventricular fibrillation: Secondary | ICD-10-CM | POA: Diagnosis not present

## 2013-09-15 DIAGNOSIS — N189 Chronic kidney disease, unspecified: Secondary | ICD-10-CM

## 2013-09-15 DIAGNOSIS — E785 Hyperlipidemia, unspecified: Secondary | ICD-10-CM | POA: Diagnosis present

## 2013-09-15 DIAGNOSIS — I10 Essential (primary) hypertension: Secondary | ICD-10-CM

## 2013-09-15 DIAGNOSIS — Z79899 Other long term (current) drug therapy: Secondary | ICD-10-CM

## 2013-09-15 DIAGNOSIS — J690 Pneumonitis due to inhalation of food and vomit: Secondary | ICD-10-CM | POA: Diagnosis present

## 2013-09-15 DIAGNOSIS — R4182 Altered mental status, unspecified: Secondary | ICD-10-CM | POA: Diagnosis present

## 2013-09-15 LAB — CBC WITH DIFFERENTIAL/PLATELET
BASOS ABS: 0 10*3/uL (ref 0.0–0.1)
Basophils Relative: 0 % (ref 0–1)
Eosinophils Absolute: 0 10*3/uL (ref 0.0–0.7)
Eosinophils Relative: 0 % (ref 0–5)
HEMATOCRIT: 25.5 % — AB (ref 39.0–52.0)
HEMOGLOBIN: 8.4 g/dL — AB (ref 13.0–17.0)
LYMPHS PCT: 4 % — AB (ref 12–46)
Lymphs Abs: 0.5 10*3/uL — ABNORMAL LOW (ref 0.7–4.0)
MCH: 31.9 pg (ref 26.0–34.0)
MCHC: 32.9 g/dL (ref 30.0–36.0)
MCV: 97 fL (ref 78.0–100.0)
MONO ABS: 0.9 10*3/uL (ref 0.1–1.0)
Monocytes Relative: 6 % (ref 3–12)
Neutro Abs: 13.3 10*3/uL — ABNORMAL HIGH (ref 1.7–7.7)
Neutrophils Relative %: 90 % — ABNORMAL HIGH (ref 43–77)
Platelets: 226 10*3/uL (ref 150–400)
RBC: 2.63 MIL/uL — ABNORMAL LOW (ref 4.22–5.81)
RDW: 16.5 % — ABNORMAL HIGH (ref 11.5–15.5)
WBC: 14.7 10*3/uL — ABNORMAL HIGH (ref 4.0–10.5)

## 2013-09-15 LAB — URINE MICROSCOPIC-ADD ON

## 2013-09-15 LAB — URINALYSIS, ROUTINE W REFLEX MICROSCOPIC
Bilirubin Urine: NEGATIVE
Glucose, UA: 100 mg/dL — AB
KETONES UR: NEGATIVE mg/dL
Nitrite: NEGATIVE
PROTEIN: 100 mg/dL — AB
Specific Gravity, Urine: 1.013 (ref 1.005–1.030)
Urobilinogen, UA: 0.2 mg/dL (ref 0.0–1.0)
pH: 7 (ref 5.0–8.0)

## 2013-09-15 LAB — TYPE AND SCREEN
ABO/RH(D): O POS
Antibody Screen: NEGATIVE

## 2013-09-15 LAB — BASIC METABOLIC PANEL
BUN: 59 mg/dL — ABNORMAL HIGH (ref 6–23)
CHLORIDE: 99 meq/L (ref 96–112)
CO2: 17 mEq/L — ABNORMAL LOW (ref 19–32)
Calcium: 8.7 mg/dL (ref 8.4–10.5)
Creatinine, Ser: 3.52 mg/dL — ABNORMAL HIGH (ref 0.50–1.35)
GFR calc Af Amer: 17 mL/min — ABNORMAL LOW (ref >90)
GFR calc non Af Amer: 14 mL/min — ABNORMAL LOW (ref >90)
Glucose, Bld: 182 mg/dL — ABNORMAL HIGH (ref 70–99)
POTASSIUM: 5.7 meq/L — AB (ref 3.7–5.3)
SODIUM: 131 meq/L — AB (ref 137–147)

## 2013-09-15 LAB — PROTIME-INR
INR: 1.23 (ref 0.00–1.49)
Prothrombin Time: 15.2 seconds (ref 11.6–15.2)

## 2013-09-15 MED ORDER — TAMSULOSIN HCL 0.4 MG PO CAPS
0.4000 mg | ORAL_CAPSULE | Freq: Every morning | ORAL | Status: DC
  Administered 2013-09-16: 0.4 mg via ORAL
  Filled 2013-09-15 (×3): qty 1

## 2013-09-15 MED ORDER — ASPIRIN 81 MG PO CHEW
81.0000 mg | CHEWABLE_TABLET | Freq: Every morning | ORAL | Status: DC
  Administered 2013-09-16: 81 mg via ORAL
  Filled 2013-09-15 (×3): qty 1

## 2013-09-15 MED ORDER — SODIUM CHLORIDE 0.9 % IV BOLUS (SEPSIS)
500.0000 mL | Freq: Once | INTRAVENOUS | Status: AC
  Administered 2013-09-15: 500 mL via INTRAVENOUS

## 2013-09-15 MED ORDER — ACETAMINOPHEN 325 MG PO TABS: 650.0000 mg | ORAL_TABLET | Freq: Four times a day (QID) | ORAL | Status: DC | PRN

## 2013-09-15 MED ORDER — ATORVASTATIN CALCIUM 20 MG PO TABS
20.0000 mg | ORAL_TABLET | Freq: Every day | ORAL | Status: DC
  Administered 2013-09-16: 20 mg via ORAL
  Filled 2013-09-15 (×3): qty 1

## 2013-09-15 MED ORDER — INSULIN ASPART 100 UNIT/ML ~~LOC~~ SOLN: 0.0000 [IU] | Freq: Every day | SUBCUTANEOUS | Status: DC

## 2013-09-15 MED ORDER — SODIUM CHLORIDE 0.9 % IV SOLN
INTRAVENOUS | Status: DC
  Administered 2013-09-16 – 2013-09-17 (×3): via INTRAVENOUS

## 2013-09-15 MED ORDER — MORPHINE SULFATE 2 MG/ML IJ SOLN: 2.0000 mg | INTRAMUSCULAR | Status: DC | PRN

## 2013-09-15 MED ORDER — PANTOPRAZOLE SODIUM 40 MG PO TBEC
40.0000 mg | DELAYED_RELEASE_TABLET | Freq: Every day | ORAL | Status: DC
  Administered 2013-09-16: 40 mg via ORAL
  Filled 2013-09-15 (×3): qty 1

## 2013-09-15 MED ORDER — ACETAMINOPHEN 650 MG RE SUPP: 650.0000 mg | Freq: Four times a day (QID) | RECTAL | Status: DC | PRN

## 2013-09-15 MED ORDER — OXYCODONE HCL 5 MG PO TABS
5.0000 mg | ORAL_TABLET | ORAL | Status: DC | PRN
  Administered 2013-09-16 (×2): 5 mg via ORAL
  Filled 2013-09-15 (×2): qty 1

## 2013-09-15 MED ORDER — INSULIN ASPART 100 UNIT/ML ~~LOC~~ SOLN
10.0000 [IU] | Freq: Once | SUBCUTANEOUS | Status: AC
  Administered 2013-09-16: 10 [IU] via SUBCUTANEOUS

## 2013-09-15 MED ORDER — SODIUM CHLORIDE 0.9 % IV SOLN
INTRAVENOUS | Status: DC
  Administered 2013-09-15: 21:00:00 via INTRAVENOUS

## 2013-09-15 MED ORDER — DEXTROSE 50 % IV SOLN
1.0000 | Freq: Once | INTRAVENOUS | Status: AC
  Administered 2013-09-16: 50 mL via INTRAVENOUS
  Filled 2013-09-15: qty 50

## 2013-09-15 MED ORDER — CEFTRIAXONE SODIUM 1 G IJ SOLR
1.0000 g | INTRAMUSCULAR | Status: DC
  Administered 2013-09-16 – 2013-09-17 (×2): 1 g via INTRAVENOUS
  Filled 2013-09-15 (×3): qty 10

## 2013-09-15 MED ORDER — ATENOLOL 50 MG PO TABS
50.0000 mg | ORAL_TABLET | Freq: Every morning | ORAL | Status: DC
  Administered 2013-09-16: 50 mg via ORAL
  Filled 2013-09-15 (×3): qty 1

## 2013-09-15 MED ORDER — DOCUSATE SODIUM 100 MG PO CAPS
100.0000 mg | ORAL_CAPSULE | Freq: Two times a day (BID) | ORAL | Status: DC
  Administered 2013-09-16 (×3): 100 mg via ORAL
  Filled 2013-09-15 (×2): qty 1

## 2013-09-15 MED ORDER — HEPARIN SODIUM (PORCINE) 5000 UNIT/ML IJ SOLN
5000.0000 [IU] | Freq: Three times a day (TID) | INTRAMUSCULAR | Status: DC
  Administered 2013-09-16 – 2013-09-17 (×7): 5000 [IU] via SUBCUTANEOUS
  Filled 2013-09-15 (×11): qty 1

## 2013-09-15 MED ORDER — INSULIN ASPART 100 UNIT/ML ~~LOC~~ SOLN: 0.0000 [IU] | Freq: Three times a day (TID) | SUBCUTANEOUS | Status: DC

## 2013-09-15 NOTE — ED Provider Notes (Signed)
CSN: [id number]     Arrival date & time 2013/09/28  1817 History   First MD Initiated Contact with Patient 28-Sep-2013 1838     Chief Complaint  Patient presents with  . Fall   (Consider location/radiation/quality/duration/timing/severity/associated sxs/prior Treatment) HPI  Patient to the ER with complaints of a fall at his independent living facility. He normally walks with a walker. Patient states that after taking a shower he slipped and fell. He was unable to reach the emergency string from where he was and had to crawl to get to it, sustaining wounds to his right elbow. He says that his left hip hurts so badly he is unable to ambulate. If he is laying still he has no pain. He denies loc, hitting his head or his neck. He has no noticeable deformities and answer questions appropriately.   Past Medical History  Diagnosis Date  . Diabetes mellitus   . Hypertension   . Pancreatitis   . CAD (coronary artery disease)   . Hyperlipemia   . Anemia     macrocytic  . DM type 2 causing renal disease   . Chronic kidney disease, stage IV (severe)    Past Surgical History  Procedure Laterality Date  . Appendectomy    . Cholecystectomy    . Angioplasty    . Ureteral stent placement      Left  . Orif humerus fracture  03/07/2012    Procedure: OPEN REDUCTION INTERNAL FIXATION (ORIF) PROXIMAL HUMERUS FRACTURE;  Surgeon: Augustin Schooling, MD;  Location: [place];  Service: Orthopedics;  Laterality: Right;   No family history on file. History  Substance Use Topics  . Smoking status: Never Smoker   . Smokeless tobacco: Not on file  . Alcohol Use: No    Review of Systems The patient denies anorexia, fever, weight loss,, vision loss, decreased hearing, hoarseness, chest pain, syncope, dyspnea on exertion, peripheral edema, balance deficits, hemoptysis, abdominal pain, melena, hematochezia, severe indigestion/heartburn, hematuria, incontinence, genital sores, muscle weakness, suspicious skin lesions,  transient blindness, difficulty walking, depression, unusual weight change, abnormal bleeding, enlarged lymph nodes, angioedema, and breast masses.  Allergies  Review of patient's allergies indicates no known allergies.  Home Medications   Current Outpatient Rx  Name  Route  Sig  Dispense  Refill  . aspirin 81 MG tablet   Oral   Take 81 mg by mouth every morning.          Marland Kitchen atenolol (TENORMIN) 50 MG tablet   Oral   Take 50 mg by mouth every morning.          . Multiple Vitamin (MULTIVITAMIN) tablet   Oral   Take 1 tablet by mouth every morning.          . rosuvastatin (CRESTOR) 10 MG tablet   Oral   Take 10 mg by mouth every other day.          . tamsulosin (FLOMAX) 0.4 MG CAPS capsule   Oral   Take 0.4 mg by mouth every morning.          BP 177/88  Pulse 85  Temp(Src) 100.1 F (37.8 C) (Oral)  Resp 24  SpO2 93% Physical Exam  Nursing note and vitals reviewed. Constitutional: He appears well-developed and well-nourished. No distress.  HENT:  Head: Normocephalic and atraumatic. Head is without raccoon's eyes, without abrasion, without contusion, without laceration, without right periorbital erythema and without left periorbital erythema.  Eyes: Pupils are equal, round, and reactive to light.  Neck: Normal range of motion. Neck supple. No spinous process tenderness and no muscular tenderness present. Normal range of motion present.  Cardiovascular: Normal rate and regular rhythm.   Pulmonary/Chest: Effort normal.  Abdominal: Soft.  Musculoskeletal:       Left hip: He exhibits decreased range of motion, decreased strength (due to pain), tenderness and bony tenderness. He exhibits no swelling, no crepitus, no deformity and no laceration.  Strong femoral pulse and pedal pulse  Neurological: He is alert.  Skin: Skin is warm and dry.    ED Course  Procedures (including critical care time) Labs Review Labs Reviewed  BASIC METABOLIC PANEL - Abnormal; Notable  for the following:    Sodium 131 (*)    Potassium 5.7 (*)    CO2 17 (*)    Glucose, Bld 182 (*)    BUN 59 (*)    Creatinine, Ser 3.52 (*)    GFR calc non Af Amer 14 (*)    GFR calc Af Amer 17 (*)    All other components within normal limits  CBC WITH DIFFERENTIAL - Abnormal; Notable for the following:    WBC 14.7 (*)    RBC 2.63 (*)    Hemoglobin 8.4 (*)    HCT 25.5 (*)    RDW 16.5 (*)    Neutrophils Relative % 90 (*)    Neutro Abs 13.3 (*)    Lymphocytes Relative 4 (*)    Lymphs Abs 0.5 (*)    All other components within normal limits  URINALYSIS, ROUTINE W REFLEX MICROSCOPIC - Abnormal; Notable for the following:    Glucose, UA 100 (*)    Hgb urine dipstick MODERATE (*)    Protein, ur 100 (*)    Leukocytes, UA MODERATE (*)    All other components within normal limits  PROTIME-INR  URINE MICROSCOPIC-ADD ON  TYPE AND SCREEN   Imaging Review Dg Chest 1 View  09/01/2013   CLINICAL DATA:  Fall.  Shortness breath and weakness.  EXAM: CHEST - 1 VIEW  COMPARISON:  08/27/2013  FINDINGS: Single view of the chest demonstrates chronic interstitial lung densities. Evidence for calcified granulomas at the right lung base. Heart size is mildly enlarged and unchanged. Old left rib fractures. Trachea is midline. Plate and screw fixation in the proximal right humerus.  IMPRESSION: Chronic interstitial lung changes without acute findings.  Mild cardiomegaly.   Electronically Signed   By: Markus Daft M.D.   On: 08/30/2013 20:47   Dg Hip Complete Left  08/30/2013   CLINICAL DATA:  Fall and left upper leg pain.  EXAM: LEFT HIP - COMPLETE 2+ VIEW  COMPARISON:  Abdominal CT 12/01/2012 and CT 08/29/2013  FINDINGS: There is concern for a fracture involving the left iliac wing. Pubic rami appear to be intact. The left hip is located without acute fracture. Mild degenerative changes in both hips.  IMPRESSION: There is concern for a fracture involving the left iliac wing. Recommend further characterization  with a CT to further evaluate this area and evaluate for any additional injuries.  The left hip is located without acute fracture.   Electronically Signed   By: Markus Daft M.D.   On: 08/29/2013 20:52   Dg Femur Left  08/21/2013   CLINICAL DATA:  Fall and left upper leg pain.  EXAM: LEFT FEMUR - 2 VIEW  COMPARISON:  Left hip 08/22/2013  FINDINGS: There is a displaced fracture of the left iliac wing. Left pubic rami appear to be intact. The  left femur is intact. Left hip is located. Degenerative changes in left knee. There may be an old injury in the proximal left fibula.  IMPRESSION: Fracture of the left iliac wing.  The left femur is intact.   Electronically Signed   By: Markus Daft M.D.   On: 09/02/2013 20:55    EKG Interpretation   None       MDM   1. Fall   2. Fracture of iliac wing   3. Hyperkalemia    Pt has sustained a fracture of the iliac wing. I spoke with Dr. Tamera Punt with ortho surgery and  He says that the treatment will most likely be conservative.  - no weight bearing restrictions will need walker -Will need PT - He recommends CT of the pelvic  wo contrast to further evaluate fracture - asks that I admit to Medicine.  Patient admitted to [facility], inpatient, team , [facility]. 8  Patient has declined pain medication throughout his stay. He has been offered multiple times. He has elevated potassium which is being treated by fluids, Hospitalist says he is comfortable with this. He also has moderate leukocytes but rare bacteria, hospitalist is comfortable with no treating for right now and waiting for culture.   Linus Mako, PA-C 08/25/2013 2159

## 2013-09-15 NOTE — ED Notes (Signed)
Bed: WA07 Expected date:  Expected time:  Means of arrival:  Comments: Hip pain

## 2013-09-15 NOTE — ED Notes (Signed)
Per EMS: Pt lives in independent living, able to walk with walker at baseline. Pt fell today onto L hip, c/o L hip pain. Denies trauma to head, no LOC. A&O x4. No deformity noted. Pt unable to bear weight on L leg. 7/10 pain when trying to put weight on leg. Left pedal pulse palpable.

## 2013-09-15 NOTE — ED Notes (Signed)
Pt supposed to be on 2 L [place] at home, states he doesn't wear it all the time.

## 2013-09-15 NOTE — H&P (Signed)
[facility] History and Physical  [name] Flynn [id number] DOB: March 15, 1924 DOA: 09/08/2013  Referring physician:  PCP: [name] Peaches, MD   Chief Complaint: Status post fall  HPI: [name] Flynn is a 80 y.o. male with a past medical history of cognitive impairment, type 2 diabetes mellitus, hypertension, coronary artery disease, who is a resident at an independent living facility, transferred to the emergency department after sustaining a fall. He was in his usual state health, slipped in the shower floor and had a fall. He was unable to bear weight given severe pain to his left hip region. He denies symptoms prior to this event including chest pain, palpitations, shortness of breath or focal neurological deficits. He was able to crawl adult of the shower and call for help. Imaging studies performed in the emergency room showed fracture involving the left iliac wing. Patient otherwise has no other complaints.                                                                                                                        Review of Systems:  Constitutional:  No weight loss, night sweats, Fevers, chills, fatigue.  HEENT:  No headaches, Difficulty swallowing,Tooth/dental problems,Sore throat,  No sneezing, itching, ear ache, nasal congestion, post nasal drip,  Cardio-vascular:  No chest pain, Orthopnea, PND, swelling in lower extremities, anasarca, dizziness, palpitations  GI:  No heartburn, indigestion, abdominal pain, nausea, vomiting, diarrhea, change in bowel habits, loss of appetite  Resp:  No shortness of breath with exertion or at rest. No excess mucus, no productive cough, No non-productive cough, No coughing up of blood.No change in color of mucus.No wheezing.No chest wall deformity  Skin:  no rash or lesions.  GU:  no dysuria, change in color of urine, no urgency or frequency. No flank pain.  Musculoskeletal:  He reports left hip pain with movement or  weightbearing. Psych:  No change in mood or affect. No depression or anxiety. No memory loss.   Past Medical History  Diagnosis Date  . Diabetes mellitus   . Hypertension   . Pancreatitis   . CAD (coronary artery disease)   . Hyperlipemia   . Anemia     macrocytic  . DM type 2 causing renal disease   . Chronic kidney disease, stage IV (severe)    Past Surgical History  Procedure Laterality Date  . Appendectomy    . Cholecystectomy    . Angioplasty    . Ureteral stent placement      Left  . Orif humerus fracture  03/07/2012    Procedure: OPEN REDUCTION INTERNAL FIXATION (ORIF) PROXIMAL HUMERUS FRACTURE;  Surgeon: Augustin Schooling, MD;  Location: [place];  Service: Orthopedics;  Laterality: Right;   Social History:  reports that he has never smoked. He does not have any smokeless tobacco history on file. He reports that he does not drink alcohol or use illicit drugs.  No Known Allergies  No family history on file.  Prior to Admission medications   Medication Sig Start Date End Date Taking? Authorizing Provider  aspirin 81 MG tablet Take 81 mg by mouth every morning.    Yes Historical Provider, MD  atenolol (TENORMIN) 50 MG tablet Take 50 mg by mouth every morning.    Yes Historical Provider, MD  Multiple Vitamin (MULTIVITAMIN) tablet Take 1 tablet by mouth every morning.    Yes Historical Provider, MD  rosuvastatin (CRESTOR) 10 MG tablet Take 10 mg by mouth every other day.    Yes Historical Provider, MD  tamsulosin (FLOMAX) 0.4 MG CAPS capsule Take 0.4 mg by mouth every morning.   Yes Historical Provider, MD   Physical Exam: Filed Vitals:   09/12/2013 2250  BP: 176/83  Pulse: 90  Temp: 98.7 F (37.1 C)  Resp: 22    BP 176/83  Pulse 90  Temp(Src) 98.7 F (37.1 C) (Oral)  Resp 22  SpO2 93%  General:  Appears calm and comfortable Eyes: PERRL, normal lids, irises & conjunctiva ENT: grossly normal hearing, lips & tongue Neck: no LAD, masses or  thyromegaly Cardiovascular: RRR, no m/r/g. No LE edema. Telemetry: SR, no arrhythmias  Respiratory: CTA bilaterally, no w/r/r. Normal respiratory effort. Abdomen: soft, ntnd Skin: no rash or induration seen on limited exam Musculoskeletal: There is pain with passive and active movement of his left lower extremity Psychiatric: grossly normal mood and affect, speech fluent and appropriate Neurologic: grossly non-focal.          Labs on Admission:  Basic Metabolic Panel:  Recent Labs Lab 08/28/2013 1925  NA 131*  K 5.7*  CL 99  CO2 17*  GLUCOSE 182*  BUN 59*  CREATININE 3.52*  CALCIUM 8.7   Liver Function Tests: No results found for this basename: AST, ALT, ALKPHOS, BILITOT, PROT, ALBUMIN,  in the last 168 hours No results found for this basename: LIPASE, AMYLASE,  in the last 168 hours No results found for this basename: AMMONIA,  in the last 168 hours CBC:  Recent Labs Lab 09/06/2013 1925  WBC 14.7*  NEUTROABS 13.3*  HGB 8.4*  HCT 25.5*  MCV 97.0  PLT 226   Cardiac Enzymes: No results found for this basename: CKTOTAL, CKMB, CKMBINDEX, TROPONINI,  in the last 168 hours  BNP (last 3 results) No results found for this basename: PROBNP,  in the last 8760 hours CBG: No results found for this basename: GLUCAP,  in the last 168 hours  Radiological Exams on Admission: Ct Abdomen Pelvis Wo Contrast  09/01/2013   CLINICAL DATA:  Fall, iliac fracture  EXAM: CT ABDOMEN AND PELVIS WITHOUT CONTRAST  TECHNIQUE: Multidetector CT imaging of the abdomen and pelvis was performed following the standard protocol without intravenous contrast.  COMPARISON:  08/29/2013  FINDINGS: Cardiomegaly. Low attenuation of the blood pool suggests anemia. Mild aortic valvular and coronary artery calcifications. Small pericardial effusion. Tiny hiatal hernia. Calcified granuloma within the right greater in the left lower lobes. Peripheral reticular opacities/fibrosis.  Organ evaluation limited without  intravenous contrast. Within this limitation, no appreciable abnormality of the liver. Cholecystectomy. No biliary ductal dilatation. No appreciable abnormality of the pancreas, spleen, adrenal glands.  Bilateral renal sinus masses encase the renal pelves and extend into the perinephric fat on the left greater than right, similar to prior. No overt hydroureteronephrosis.  Extensive colonic diverticulosis. No CT evidence for colitis or diverticulitis. No bowel obstruction. No free intraperitoneal air or fluid. Peritoneal implant versus enlarged lymph node on image 63 of 91 is unchanged.  Advanced atherosclerotic disease of the aorta and branch vessels with areas of ectasia. Mild dilatation of the right common iliac artery up to 1.5 cm.  Thin walled bladder. Prostatomegaly at 6.1 cm transverse diameter. Fat containing right greater than left inguinal hernias.  Diffuse osteopenia. Mildly comminuted and displaced left iliac bone fractures. The femoral heads remain seated within the acetabula. No overt SI joint extension or displaced sacral fracture identified.  IMPRESSION: Comminuted and mildly displaced left iliac bone fractures.  Other unchanged findings as above.   Electronically Signed   By: Carlos Levering M.D.   On: 09/14/2013 22:23   Dg Chest 1 View  08/19/2013   CLINICAL DATA:  Fall.  Shortness breath and weakness.  EXAM: CHEST - 1 VIEW  COMPARISON:  08/27/2013  FINDINGS: Single view of the chest demonstrates chronic interstitial lung densities. Evidence for calcified granulomas at the right lung base. Heart size is mildly enlarged and unchanged. Old left rib fractures. Trachea is midline. Plate and screw fixation in the proximal right humerus.  IMPRESSION: Chronic interstitial lung changes without acute findings.  Mild cardiomegaly.   Electronically Signed   By: Markus Daft M.D.   On: 08/26/2013 20:47   Dg Hip Complete Left  09/02/2013   CLINICAL DATA:  Fall and left upper leg pain.  EXAM: LEFT HIP -  COMPLETE 2+ VIEW  COMPARISON:  Abdominal CT 12/01/2012 and CT 08/29/2013  FINDINGS: There is concern for a fracture involving the left iliac wing. Pubic rami appear to be intact. The left hip is located without acute fracture. Mild degenerative changes in both hips.  IMPRESSION: There is concern for a fracture involving the left iliac wing. Recommend further characterization with a CT to further evaluate this area and evaluate for any additional injuries.  The left hip is located without acute fracture.   Electronically Signed   By: Markus Daft M.D.   On: 08/25/2013 20:52   Dg Femur Left  09/05/2013   CLINICAL DATA:  Fall and left upper leg pain.  EXAM: LEFT FEMUR - 2 VIEW  COMPARISON:  Left hip 09/12/2013  FINDINGS: There is a displaced fracture of the left iliac wing. Left pubic rami appear to be intact. The left femur is intact. Left hip is located. Degenerative changes in left knee. There may be an old injury in the proximal left fibula.  IMPRESSION: Fracture of the left iliac wing.  The left femur is intact.   Electronically Signed   By: Markus Daft M.D.   On: 09/01/2013 20:55    EKG: Independently reviewed.   Assessment/Plan Active Problems:   Hypertension   DM type 2 causing renal disease   Chronic kidney disease, stage IV (severe)   Fracture of iliac wing   Fall   Hyperkalemia   Fracture of left iliac wing   1. Fracture of left iliac wing. Patient sustaining a fall in the shower today, injuring his left hip region. He was unable to bear weight, brought to the emergency room, where imaging studies including CT scan of pelvis showed a comminuted and mildly displaced left iliac bone fracture. Orthopedic surgery has been consulted. It appears that conservative management will likely be pursued. Consult physical therapy and occupational therapy. 2. Hyperkalemia. Initial lab work showing a potassium of 5.7, I suspect secondary to chronic kidney disease. Will provide an appendectomy D50 with 10  units of insulin, recheck lab work in a.m. 3. Leukocytosis. Patient presenting with a white count of 14,700, could be secondary  to iliac fracture. Urine also showed the presence of diarrhea, will cover empirically with ceftriaxone 1 g IV every 24 hours. 4. Stage IV chronic kidney disease. Stable. Patient's having a baseline creatinine between 3.5 and 3.7. 5. Type 2 diabetes mellitus. Provide Accu-Cheks q. a.c. and each bedtime with sliding scale coverage 6. Hypertension. Will continue his home dose of atenolol 50 mg by mouth daily 7.  possible urinary tract infection. Urinalysis showing moderate leukocyte esterase with rare bacteria. Will check a urine culture, meanwhile cover empirically with ceftriaxone gram IV every 24 hours. 8. DVT prophylaxis. Heparin subcutaneous    Code Status: Full code Disposition Plan: Anticipate patient will record greater than 2 night hospitalization  Time spent: 65 minutes  Kelvin Cellar [facility] Pager [phone]

## 2013-09-16 ENCOUNTER — Encounter (HOSPITAL_COMMUNITY): Payer: Self-pay | Admitting: Orthopedic Surgery

## 2013-09-16 DIAGNOSIS — N184 Chronic kidney disease, stage 4 (severe): Secondary | ICD-10-CM

## 2013-09-16 DIAGNOSIS — E1129 Type 2 diabetes mellitus with other diabetic kidney complication: Secondary | ICD-10-CM

## 2013-09-16 LAB — BASIC METABOLIC PANEL
BUN: 58 mg/dL — AB (ref 6–23)
CO2: 18 mEq/L — ABNORMAL LOW (ref 19–32)
CREATININE: 3.72 mg/dL — AB (ref 0.50–1.35)
Calcium: 8.4 mg/dL (ref 8.4–10.5)
Chloride: 99 mEq/L (ref 96–112)
GFR calc Af Amer: 15 mL/min — ABNORMAL LOW (ref >90)
GFR, EST NON AFRICAN AMERICAN: 13 mL/min — AB (ref >90)
GLUCOSE: 103 mg/dL — AB (ref 70–99)
Potassium: 4.9 mEq/L (ref 3.7–5.3)
Sodium: 132 mEq/L — ABNORMAL LOW (ref 137–147)

## 2013-09-16 LAB — GLUCOSE, CAPILLARY
GLUCOSE-CAPILLARY: 120 mg/dL — AB (ref 70–99)
Glucose-Capillary: 155 mg/dL — ABNORMAL HIGH (ref 70–99)

## 2013-09-16 LAB — CBC
HCT: 24.3 % — ABNORMAL LOW (ref 39.0–52.0)
Hemoglobin: 7.8 g/dL — ABNORMAL LOW (ref 13.0–17.0)
MCH: 31.5 pg (ref 26.0–34.0)
MCHC: 32.1 g/dL (ref 30.0–36.0)
MCV: 98 fL (ref 78.0–100.0)
Platelets: 217 10*3/uL (ref 150–400)
RBC: 2.48 MIL/uL — ABNORMAL LOW (ref 4.22–5.81)
RDW: 16.7 % — AB (ref 11.5–15.5)
WBC: 14.6 10*3/uL — ABNORMAL HIGH (ref 4.0–10.5)

## 2013-09-16 NOTE — Progress Notes (Signed)
OT Cancellation Note  Patient Details Name: [name] MRN: [id number] DOB: [birth year]   Cancelled Treatment:    Reason Eval/Treat Not Completed: Fatigue/lethargy limiting ability to participate.  Will check back tomorrow.    Marlean Mortell 09/16/2013, 12:07 PM Lesle Chris, OTR/L [phone] 09/16/2013

## 2013-09-16 NOTE — Evaluation (Signed)
Physical Therapy Evaluation Patient Details Name: [name] Flynn MRN: [id number] DOB: 07-Sep-1923 Today's Date: 09/16/2013 Time: 6213-0865 PT Time Calculation (min): 27 min  PT Assessment / Plan / Recommendation History of Present Illness  [name] Flynn is a 80 y.o. male with a past medical history of cognitive impairment, type 2 diabetes mellitus, hypertension, coronary artery disease, who is a resident at an independent living facility, transferred to the emergency department after sustaining a fall. He was in his usual state health, slipped in the shower floor and had a fall. He was unable to bear weight given severe pain to his left hip region. He denies symptoms prior to this event including chest pain, palpitations, shortness of breath or focal neurological deficits. He was able to crawl adult of the shower and call for help. Imaging studies performed in the emergency room showed fracture involving the left iliac wing. Patient otherwise has no other complaints.     Clinical Impression  Pt will benefit from PT to address deficits below; pt is extremely lethargic, he arouses only slightly to sternal run, bright light and cold cloth as well as mobility; he is unable to verbalize during session; pt was awake earlier per RN and nurse tech    PT Assessment  Patient needs continued PT services    Follow Up Recommendations  SNF    Does the patient have the potential to tolerate intense rehabilitation      Barriers to Discharge        Equipment Recommendations  None recommended by PT    Recommendations for Other Services     Frequency Min 3X/week    Precautions / Restrictions Precautions Precautions: Fall Restrictions Weight Bearing Restrictions: No Other Position/Activity Restrictions: per MD notes "no wt bearing restrictions" (ortho consult hospitalist to Dr Tamera Punt)   Pertinent Vitals/Pain Pt sats 87% on 3L after sitting EOB; pt appears to stop breathing intermittently; RN  aware      Mobility  Bed Mobility Overal bed mobility: +2 for physical assistance Bed Mobility: Supine to Sit;Sit to Supine Supine to sit: Total assist;+2 for physical assistance Sit to supine: Total assist;+2 for physical assistance General bed mobility comments: pt lethargic, unable to participate at this time, requiring +2 assist UB/LB and for safety Transfers General transfer comment: not tested due to lethargy, pt unable    Exercises     PT Diagnosis: Generalized weakness  PT Problem List: Decreased activity tolerance;Decreased balance;Decreased mobility;Decreased strength PT Treatment Interventions: Gait training;Functional mobility training;Therapeutic activities;Therapeutic exercise;Patient/family education     PT Goals(Current goals can be found in the care plan section) Acute Rehab PT Goals Patient Stated Goal: pt unable to state PT Goal Formulation: With patient Time For Goal Achievement: 09/23/13 Potential to Achieve Goals: Good  Visit Information  Last PT Received On: 09/16/13 Assistance Needed: +1 History of Present Illness: [name] Flynn is a 80 y.o. male with a past medical history of cognitive impairment, type 2 diabetes mellitus, hypertension, coronary artery disease, who is a resident at an independent living facility, transferred to the emergency department after sustaining a fall. He was in his usual state health, slipped in the shower floor and had a fall. He was unable to bear weight given severe pain to his left hip region. He denies symptoms prior to this event including chest pain, palpitations, shortness of breath or focal neurological deficits. He was able to crawl adult of the shower and call for help. Imaging studies performed in the emergency room showed fracture  involving the left iliac wing. Patient otherwise has no other complaints.          Prior Leonardo expects to be discharged to:: Private residence Living  Arrangements: Alone Type of Home: Independent living facility Home Access: Level entry Home Layout: One [place]: Shower seat - built in;Walker - 4 wheels;Cane - single point;Grab bars - toilet;Grab bars - tub/shower;Hand held shower head Additional Comments: pt resides at [facility] independent living Prior Function Level of Independence: Independent with assistive device(s) Communication Communication:  (unable to assess due to lethargy)    Cognition  Cognition Arousal/Alertness: Lethargic;Suspect due to medications Behavior During Therapy: Flat affect Overall Cognitive Status: History of cognitive impairments - at baseline    Extremity/Trunk Assessment Upper Extremity Assessment Upper Extremity Assessment: Difficult to assess due to impaired cognition Lower Extremity Assessment Lower Extremity Assessment: Difficult to assess due to impaired cognition   Balance Balance Overall balance assessment: History of Falls;Needs assistance Sitting-balance support: Feet supported;No upper extremity supported;Single extremity supported Sitting balance-Leahy Scale: Zero Sitting balance - Comments: pt requires assist to maintain midline Postural control: Posterior lean;Left lateral lean  End of Session PT - End of Session Activity Tolerance: Patient tolerated treatment well Patient left: in bed;with call bell/phone within reach Nurse Communication: Mobility status;Other (comment)  GP     [facility] 09/16/2013, 11:56 AM

## 2013-09-16 NOTE — Progress Notes (Signed)
Clinical Social Work Department BRIEF PSYCHOSOCIAL ASSESSMENT 09/16/2013  Patient:  [name] Flynn, [name] Flynn     Account Number:  [id number]     Admit date:  09/05/2013  Clinical Social Worker:  Lacie Scotts  Date/Time:  09/16/2013 01:39 PM  Referred by:  Physician  Date Referred:  09/16/2013 Referred for  SNF Placement   Other Referral:   Interview type:  Family Other interview type:    PSYCHOSOCIAL DATA Living Status:  FACILITY Admitted from facility:  [facility] Level of care:  Independent Living Primary support name:  Blenda Bridegroom Primary support relationship to patient:  CHILD, ADULT Degree of support available:   supportive    CURRENT CONCERNS Current Concerns  Post-Acute Placement   Other Concerns:    SOCIAL WORK ASSESSMENT / PLAN Pt is an 80 yr old gentleman admitted from [facility]. CSW met with pt's daughter to assist with d/c planning. Pt sleeping soundly at time of visit. PT is recommending SNF placement following hospital d/c. [facility] contacted and message left for SW explaining pt's need for higher level of care. Awaiting return call to confirm that a SNF bed will be available. CSW has submitted request for SNF / ambulance authorization with [facility].   Assessment/plan status:  Psychosocial Support/Ongoing Assessment of Needs Other assessment/ plan:   Information/referral to community resources:   Insurance coverage for SNF reviewed .    PATIENT'S/FAMILY'S RESPONSE TO PLAN OF CARE: Daughter has spoken with Independent Living rep at [facility] asking for help getting pt into skilled unit for rehab. " My dad can be stubborn but he is going to have to go to the rehab unit. "    Werner Lean LCSW [phone]

## 2013-09-16 NOTE — Consult Note (Signed)
Seen and agree with above.  Will f/u in our office in 3 wks.

## 2013-09-16 NOTE — Progress Notes (Signed)
CSW spoke with admissions  ( [name] ) at [facility]. They will have a SNF bed available on [facility] for this pt. [name] Flynn is willing to accept pt back while waiting for [name] authorization. CSW will assist with d/c planning back to [facility] when stable.  Werner Lean LCSW [phone]

## 2013-09-16 NOTE — Progress Notes (Signed)
INITIAL NUTRITION ASSESSMENT  DOCUMENTATION CODES Per approved criteria  -Not Applicable   INTERVENTION: - Ensure Complete BID  - Will continue to monitor   NUTRITION DIAGNOSIS: Increased nutrient needs related to fracture of left iliac wing as evidenced by MD notes.   Goal: Pt to consume >90% of meals/supplements  Monitor:  Weights, labs, intake  Reason for Assessment: Malnutrition screening tool   80 y.o. male  Admitting Dx: Fall   ASSESSMENT: Pt with history of cognitive impairment, type 2 diabetes mellitus, hypertension, coronary artery disease, who is a resident at an independent living facility, transferred to the emergency department after sustaining a fall. He was in his usual state health, slipped in the shower floor and had a fall. He was unable to bear weight given severe pain to his left hip region. He denies symptoms prior to this event including chest pain, palpitations, shortness of breath or focal neurological deficits. He was able to crawl adult of the shower and call for help. Imaging studies performed in the emergency room showed fracture involving the left iliac wing. Patient otherwise has no other complaints.   Pt asleep during visit, pt's daughter present at bedside. She reports pt typically eats 3 meals/day but intake has gone down recently due to pain. He required assistance with his breakfast this morning which is unusual for him. Thought he had lost weight however past weight trend shows pt's weight up a few pounds in the past month. Denies that pt has any problems chewing or swallowing.   BUN/Cr elevated with low GFR - with stage IV CKD   Height: Ht Readings from Last 1 Encounters:  09/10/2013 5\' 8"  (1.727 m)    Weight: Wt Readings from Last 1 Encounters:  08/22/2013 163 lb 2.3 oz (74 kg)    Ideal Body Weight: 154 lb   % Ideal Body Weight: 106%  Wt Readings from Last 10 Encounters:  08/23/2013 163 lb 2.3 oz (74 kg)  09/01/13 156 lb 15.5 oz (71.2  kg)  06/11/13 162 lb 8 oz (73.71 kg)  12/10/12 165 lb 3.2 oz (74.934 kg)  06/05/12 156 lb 14.4 oz (71.169 kg)  03/07/12 161 lb (73.029 kg)  03/07/12 161 lb (73.029 kg)  12/05/11 158 lb 9.6 oz (71.94 kg)  09/05/11 160 lb 3.2 oz (72.666 kg)  03/20/11 157 lb (71.215 kg)    Usual Body Weight: 156 lb earlier this month  % Usual Body Weight: 104%  BMI:  Body mass index is 24.81 kg/(m^2).  Estimated Nutritional Needs: Kcal: 1700-1900 Protein: 75-90g Fluid: 1.7-1.9L/day   Skin: Right elbow skin tear   Diet Order: General  EDUCATION NEEDS: -No education needs identified at this time   Intake/Output Summary (Last 24 hours) at 09/16/13 1112 Last data filed at 09/16/13 0830  Gross per 24 hour  Intake    605 ml  Output    300 ml  Net    305 ml    Last BM: 1/24   Labs:   Recent Labs Lab 08/22/2013 1925 09/16/13 0505  NA 131* 132*  K 5.7* 4.9  CL 99 99  CO2 17* 18*  BUN 59* 58*  CREATININE 3.52* 3.72*  CALCIUM 8.7 8.4  GLUCOSE 182* 103*    CBG (last 3)  No results found for this basename: GLUCAP,  in the last 72 hours  Scheduled Meds: . aspirin  81 mg Oral q morning - 10a  . atenolol  50 mg Oral q morning - 10a  . atorvastatin  20 mg Oral q1800  . cefTRIAXone (ROCEPHIN)  IV  1 g Intravenous Q24H  . docusate sodium  100 mg Oral BID  . heparin  5,000 Units Subcutaneous Q8H  . insulin aspart  0-15 Units Subcutaneous TID WC  . insulin aspart  0-5 Units Subcutaneous QHS  . pantoprazole  40 mg Oral Daily  . tamsulosin  0.4 mg Oral q morning - 10a    Continuous Infusions: . sodium chloride 75 mL/hr at 09/02/2013 2330    Past Medical History  Diagnosis Date  . Diabetes mellitus   . Hypertension   . Pancreatitis   . CAD (coronary artery disease)   . Hyperlipemia   . Anemia     macrocytic  . DM type 2 causing renal disease   . Chronic kidney disease, stage IV (severe)     Past Surgical History  Procedure Laterality Date  . Appendectomy    .  Cholecystectomy    . Angioplasty    . Ureteral stent placement      Left  . Orif humerus fracture  03/07/2012    Procedure: OPEN REDUCTION INTERNAL FIXATION (ORIF) PROXIMAL HUMERUS FRACTURE;  Surgeon: Augustin Schooling, MD;  Location: [facility];  Service: Orthopedics;  Laterality: Right;    Mikey College MS, [place], [place] Pager [phone] After Hours Pager

## 2013-09-16 NOTE — ED Provider Notes (Signed)
Medical screening examination/treatment/procedure(s) were conducted as a shared visit with non-physician practitioner(s) and myself.  I personally evaluated the patient during the encounter.  EKG Interpretation    Date/Time:    Ventricular Rate:    PR Interval:    QRS Duration:   QT Interval:    QTC Calculation:   R Axis:     Text Interpretation:              I interviewed and examined the patient. Lungs are CTAB. Cardiac exam wnl. Abdomen soft.  Pt continues to appear well on exam. Will admit to [facility].   Blanchard Kelch, MD 09/16/13 1212

## 2013-09-16 NOTE — Progress Notes (Addendum)
[facility] PROGRESS NOTE  QUINT CHESTNUT URK:[id number] DOB: 1924-01-10 DOA: 08/25/2013 PCP: Criselda Peaches, MD Brief narrative 80 year old male with history of type 2 diabetes mellitus, hypertension, coronary artery disease, mild dementia resident of independent living, presented to the ED after sustaining a mechanical fall with fracture of left iliac wing.  Assessment/Plan: Fracture of the left iliac wing CT scan of pelvis showed a comminuted and mildly displaced left iliac bone fracture. Plan on conservative management including pain control and weightbearing as tolerated. PT/OT consulted and recommended for skilled nursing facility.Marland Kitchen Appreciate orthopedic consult . followup as outpatient in 2 weeks. Monitor sedation and worsening confusion on pain medications. We'll place him on a when necessary tramadol and oxycodone as needed   Hyperkalemia Patient given D50 with 10 units insulin on admission. Resolved this a.m.  UTI On empiric Rocephin. follow culture  Stage IV chronic kidney disease Baseline creatinine between 2.5-2.7 and he is stable.  Type 2 diabetes mellitus Continue with sliding scale insulin  Hypertension Resume home dose atenolol   Anemia of chronic disease Hemoglobin at baseline  Diet: Diabetic DVT prophylaxis: Subcutaneous heparin  Code Status: Full code Family Communication: Daughter at bedside  Disposition Plan: Skilled nursing facility   Consultants:  Orthopedics  Procedures:  None  Antibiotics:  IV Rocephin  HPI/Subjective: Lesion seen and examined this morning. Complains of pain over his left hip  Objective: Filed Vitals:   09/11/2013 2250  BP: 176/83  Pulse: 90  Temp: 98.7 F (37.1 C)  Resp: 22    Intake/Output Summary (Last 24 hours) at 09/16/13 1341 Last data filed at 09/16/13 1000  Gross per 24 hour  Intake  957.5 ml  Output    300 ml  Net  657.5 ml   Filed Weights   09/07/2013 2300  Weight: 74 kg (163 lb 2.3  oz)    Exam:   General:  Elderly male lying in bed in no acute distress, appears confused  HEENT: No pallor, moist oral mucosa  Chest: Clear to auscultation bilaterally, sounds  CVS: Normal S1 and S2, no murmurs rub or gallop  Abdomen: Soft, nontender, nondistended, bowel sounds within,  Extremities: Limited range of motion over  left hip and tender to palpation  CNS: AAO x2, has periodic confusion, nonfocal  Data Reviewed: Basic Metabolic Panel:  Recent Labs Lab 09/14/2013 1925 09/16/13 0505  NA 131* 132*  K 5.7* 4.9  CL 99 99  CO2 17* 18*  GLUCOSE 182* 103*  BUN 59* 58*  CREATININE 3.52* 3.72*  CALCIUM 8.7 8.4   Liver Function Tests: No results found for this basename: AST, ALT, ALKPHOS, BILITOT, PROT, ALBUMIN,  in the last 168 hours No results found for this basename: LIPASE, AMYLASE,  in the last 168 hours No results found for this basename: AMMONIA,  in the last 168 hours CBC:  Recent Labs Lab 09/11/2013 1925 09/16/13 0505  WBC 14.7* 14.6*  NEUTROABS 13.3*  --   HGB 8.4* 7.8*  HCT 25.5* 24.3*  MCV 97.0 98.0  PLT 226 217   Cardiac Enzymes: No results found for this basename: CKTOTAL, CKMB, CKMBINDEX, TROPONINI,  in the last 168 hours BNP (last 3 results) No results found for this basename: PROBNP,  in the last 8760 hours CBG:  Recent Labs Lab 09/16/13 0725  GLUCAP 120*    No results found for this or any previous visit (from the past 240 hour(s)).   Studies: Ct Abdomen Pelvis Wo Contrast  09/08/2013   CLINICAL DATA:  Fall, iliac fracture  EXAM: CT ABDOMEN AND PELVIS WITHOUT CONTRAST  TECHNIQUE: Multidetector CT imaging of the abdomen and pelvis was performed following the standard protocol without intravenous contrast.  COMPARISON:  08/29/2013  FINDINGS: Cardiomegaly. Low attenuation of the blood pool suggests anemia. Mild aortic valvular and coronary artery calcifications. Small pericardial effusion. Tiny hiatal hernia. Calcified granuloma within  the right greater in the left lower lobes. Peripheral reticular opacities/fibrosis.  Organ evaluation limited without intravenous contrast. Within this limitation, no appreciable abnormality of the liver. Cholecystectomy. No biliary ductal dilatation. No appreciable abnormality of the pancreas, spleen, adrenal glands.  Bilateral renal sinus masses encase the renal pelves and extend into the perinephric fat on the left greater than right, similar to prior. No overt hydroureteronephrosis.  Extensive colonic diverticulosis. No CT evidence for colitis or diverticulitis. No bowel obstruction. No free intraperitoneal air or fluid. Peritoneal implant versus enlarged lymph node on image 63 of 91 is unchanged.  Advanced atherosclerotic disease of the aorta and branch vessels with areas of ectasia. Mild dilatation of the right common iliac artery up to 1.5 cm.  Thin walled bladder. Prostatomegaly at 6.1 cm transverse diameter. Fat containing right greater than left inguinal hernias.  Diffuse osteopenia. Mildly comminuted and displaced left iliac bone fractures. The femoral heads remain seated within the acetabula. No overt SI joint extension or displaced sacral fracture identified.  IMPRESSION: Comminuted and mildly displaced left iliac bone fractures.  Other unchanged findings as above.   Electronically Signed   By: Carlos Levering M.D.   On: 08/19/2013 22:23   Dg Chest 1 View  09/01/2013   CLINICAL DATA:  Fall.  Shortness breath and weakness.  EXAM: CHEST - 1 VIEW  COMPARISON:  08/27/2013  FINDINGS: Single view of the chest demonstrates chronic interstitial lung densities. Evidence for calcified granulomas at the right lung base. Heart size is mildly enlarged and unchanged. Old left rib fractures. Trachea is midline. Plate and screw fixation in the proximal right humerus.  IMPRESSION: Chronic interstitial lung changes without acute findings.  Mild cardiomegaly.   Electronically Signed   By: Markus Daft M.D.   On:  09/12/2013 20:47   Dg Hip Complete Left  09/14/2013   CLINICAL DATA:  Fall and left upper leg pain.  EXAM: LEFT HIP - COMPLETE 2+ VIEW  COMPARISON:  Abdominal CT 12/01/2012 and CT 08/29/2013  FINDINGS: There is concern for a fracture involving the left iliac wing. Pubic rami appear to be intact. The left hip is located without acute fracture. Mild degenerative changes in both hips.  IMPRESSION: There is concern for a fracture involving the left iliac wing. Recommend further characterization with a CT to further evaluate this area and evaluate for any additional injuries.  The left hip is located without acute fracture.   Electronically Signed   By: Markus Daft M.D.   On: 08/22/2013 20:52   Dg Femur Left  08/20/2013   CLINICAL DATA:  Fall and left upper leg pain.  EXAM: LEFT FEMUR - 2 VIEW  COMPARISON:  Left hip 09/13/2013  FINDINGS: There is a displaced fracture of the left iliac wing. Left pubic rami appear to be intact. The left femur is intact. Left hip is located. Degenerative changes in left knee. There may be an old injury in the proximal left fibula.  IMPRESSION: Fracture of the left iliac wing.  The left femur is intact.   Electronically Signed   By: Markus Daft M.D.   On: 08/26/2013 20:55  Scheduled Meds: . aspirin  81 mg Oral q morning - 10a  . atenolol  50 mg Oral q morning - 10a  . atorvastatin  20 mg Oral q1800  . cefTRIAXone (ROCEPHIN)  IV  1 g Intravenous Q24H  . docusate sodium  100 mg Oral BID  . heparin  5,000 Units Subcutaneous Q8H  . insulin aspart  0-15 Units Subcutaneous TID WC  . insulin aspart  0-5 Units Subcutaneous QHS  . pantoprazole  40 mg Oral Daily  . tamsulosin  0.4 mg Oral q morning - 10a   Continuous Infusions: . sodium chloride 75 mL/hr at 09/16/13 1208      Time spent: 25 minutes    Josie Burleigh  [facility] Pager [phone] If 7PM-7AM, please contact night-coverage at [email or web address], password [facility] 09/16/2013, 1:41 PM  LOS: 1 day

## 2013-09-16 NOTE — Consult Note (Signed)
Reason for Consult:left hip pain  Referring Physician: Corbitt, [name] is an 80 y.o. male.  HPI: Patient is a 80 y.o. male with a past medical history of cognitive impairment, type 2 diabetes mellitus, hypertension, coronary artery disease. Fell at an independent living facility yesterday 09/05/2013 while trying to get out of the shower. Reported left hip pain. Daughter gives history today as patient is not orientatedx3 during exam. Imaging studies performed in the emergency room showed comminuted, nondisplaced lift illiac wing fracture. Daughter denies patient complaints of other musculoskeletal pain. At baseline is ambulatory and has a walker for assistance.   Past Medical History  Diagnosis Date  . Diabetes mellitus   . Hypertension   . Pancreatitis   . CAD (coronary artery disease)   . Hyperlipemia   . Anemia     macrocytic  . DM type 2 causing renal disease   . Chronic kidney disease, stage IV (severe)     Past Surgical History  Procedure Laterality Date  . Appendectomy    . Cholecystectomy    . Angioplasty    . Ureteral stent placement      Left  . Orif humerus fracture  03/07/2012    Procedure: OPEN REDUCTION INTERNAL FIXATION (ORIF) PROXIMAL HUMERUS FRACTURE;  Surgeon: Augustin Schooling, MD;  Location: [facility];  Service: Orthopedics;  Laterality: Right;    History reviewed. No pertinent family history.  Social History:  reports that he has never smoked. He does not have any smokeless tobacco history on file. He reports that he does not drink alcohol or use illicit drugs.  Allergies: No Known Allergies  Medications: I have reviewed the patient's current medications.  Results for orders placed during the hospital encounter of 09/14/2013 (from the past 48 hour(s))  URINALYSIS, ROUTINE W REFLEX MICROSCOPIC     Status: Abnormal   Collection Time    08/30/2013  7:13 PM      Result Value Range   Color, Urine YELLOW  YELLOW   APPearance CLEAR  CLEAR   Specific Gravity,  Urine 1.013  1.005 - 1.030   pH 7.0  5.0 - 8.0   Glucose, UA 100 (*) NEGATIVE mg/dL   Hgb urine dipstick MODERATE (*) NEGATIVE   Bilirubin Urine NEGATIVE  NEGATIVE   Ketones, ur NEGATIVE  NEGATIVE mg/dL   Protein, ur 100 (*) NEGATIVE mg/dL   Urobilinogen, UA 0.2  0.0 - 1.0 mg/dL   Nitrite NEGATIVE  NEGATIVE   Leukocytes, UA MODERATE (*) NEGATIVE  URINE MICROSCOPIC-ADD ON     Status: None   Collection Time    09/01/2013  7:13 PM      Result Value Range   Squamous Epithelial / LPF RARE  RARE   WBC, UA 21-50  <3 WBC/hpf   RBC / HPF 3-6  <3 RBC/hpf   Bacteria, UA RARE  RARE  BASIC METABOLIC PANEL     Status: Abnormal   Collection Time    08/29/2013  7:25 PM      Result Value Range   Sodium 131 (*) 137 - 147 mEq/L   Potassium 5.7 (*) 3.7 - 5.3 mEq/L   Chloride 99  96 - 112 mEq/L   CO2 17 (*) 19 - 32 mEq/L   Glucose, Bld 182 (*) 70 - 99 mg/dL   BUN 59 (*) 6 - 23 mg/dL   Creatinine, Ser 3.52 (*) 0.50 - 1.35 mg/dL   Calcium 8.7  8.4 - 10.5 mg/dL   GFR calc  non Af Amer 14 (*) >90 mL/min   GFR calc Af Amer 17 (*) >90 mL/min   Comment: (NOTE)     The eGFR has been calculated using the CKD EPI equation.     This calculation has not been validated in all clinical situations.     eGFR's persistently <90 mL/min signify possible Chronic Kidney     Disease.  CBC WITH DIFFERENTIAL     Status: Abnormal   Collection Time    08/22/2013  7:25 PM      Result Value Range   WBC 14.7 (*) 4.0 - 10.5 K/uL   RBC 2.63 (*) 4.22 - 5.81 MIL/uL   Hemoglobin 8.4 (*) 13.0 - 17.0 g/dL   HCT 25.5 (*) 39.0 - 52.0 %   MCV 97.0  78.0 - 100.0 fL   MCH 31.9  26.0 - 34.0 pg   MCHC 32.9  30.0 - 36.0 g/dL   RDW 16.5 (*) 11.5 - 15.5 %   Platelets 226  150 - 400 K/uL   Neutrophils Relative % 90 (*) 43 - 77 %   Neutro Abs 13.3 (*) 1.7 - 7.7 K/uL   Lymphocytes Relative 4 (*) 12 - 46 %   Lymphs Abs 0.5 (*) 0.7 - 4.0 K/uL   Monocytes Relative 6  3 - 12 %   Monocytes Absolute 0.9  0.1 - 1.0 K/uL   Eosinophils Relative  0  0 - 5 %   Eosinophils Absolute 0.0  0.0 - 0.7 K/uL   Basophils Relative 0  0 - 1 %   Basophils Absolute 0.0  0.0 - 0.1 K/uL  PROTIME-INR     Status: None   Collection Time    09/10/2013  7:25 PM      Result Value Range   Prothrombin Time 15.2  11.6 - 15.2 seconds   INR 1.23  0.00 - 1.49  TYPE AND SCREEN     Status: None   Collection Time    09/03/2013  7:30 PM      Result Value Range   ABO/RH(D) O POS     Antibody Screen NEG     Sample Expiration 81/82/9937    BASIC METABOLIC PANEL     Status: Abnormal   Collection Time    09/16/13  5:05 AM      Result Value Range   Sodium 132 (*) 137 - 147 mEq/L   Potassium 4.9  3.7 - 5.3 mEq/L   Chloride 99  96 - 112 mEq/L   CO2 18 (*) 19 - 32 mEq/L   Glucose, Bld 103 (*) 70 - 99 mg/dL   BUN 58 (*) 6 - 23 mg/dL   Creatinine, Ser 3.72 (*) 0.50 - 1.35 mg/dL   Calcium 8.4  8.4 - 10.5 mg/dL   GFR calc non Af Amer 13 (*) >90 mL/min   GFR calc Af Amer 15 (*) >90 mL/min   Comment: (NOTE)     The eGFR has been calculated using the CKD EPI equation.     This calculation has not been validated in all clinical situations.     eGFR's persistently <90 mL/min signify possible Chronic Kidney     Disease.  CBC     Status: Abnormal   Collection Time    09/16/13  5:05 AM      Result Value Range   WBC 14.6 (*) 4.0 - 10.5 K/uL   RBC 2.48 (*) 4.22 - 5.81 MIL/uL   Hemoglobin 7.8 (*) 13.0 - 17.0 g/dL  HCT 24.3 (*) 39.0 - 52.0 %   MCV 98.0  78.0 - 100.0 fL   MCH 31.5  26.0 - 34.0 pg   MCHC 32.1  30.0 - 36.0 g/dL   RDW 16.7 (*) 11.5 - 15.5 %   Platelets 217  150 - 400 K/uL    Ct Abdomen Pelvis Wo Contrast  08/24/2013   CLINICAL DATA:  Fall, iliac fracture  EXAM: CT ABDOMEN AND PELVIS WITHOUT CONTRAST  TECHNIQUE: Multidetector CT imaging of the abdomen and pelvis was performed following the standard protocol without intravenous contrast.  COMPARISON:  08/29/2013  FINDINGS: Cardiomegaly. Low attenuation of the blood pool suggests anemia. Mild aortic  valvular and coronary artery calcifications. Small pericardial effusion. Tiny hiatal hernia. Calcified granuloma within the right greater in the left lower lobes. Peripheral reticular opacities/fibrosis.  Organ evaluation limited without intravenous contrast. Within this limitation, no appreciable abnormality of the liver. Cholecystectomy. No biliary ductal dilatation. No appreciable abnormality of the pancreas, spleen, adrenal glands.  Bilateral renal sinus masses encase the renal pelves and extend into the perinephric fat on the left greater than right, similar to prior. No overt hydroureteronephrosis.  Extensive colonic diverticulosis. No CT evidence for colitis or diverticulitis. No bowel obstruction. No free intraperitoneal air or fluid. Peritoneal implant versus enlarged lymph node on image 63 of 91 is unchanged.  Advanced atherosclerotic disease of the aorta and branch vessels with areas of ectasia. Mild dilatation of the right common iliac artery up to 1.5 cm.  Thin walled bladder. Prostatomegaly at 6.1 cm transverse diameter. Fat containing right greater than left inguinal hernias.  Diffuse osteopenia. Mildly comminuted and displaced left iliac bone fractures. The femoral heads remain seated within the acetabula. No overt SI joint extension or displaced sacral fracture identified.  IMPRESSION: Comminuted and mildly displaced left iliac bone fractures.  Other unchanged findings as above.   Electronically Signed   By: Carlos Levering M.D.   On: 09/09/2013 22:23   Dg Chest 1 View  08/28/2013   CLINICAL DATA:  Fall.  Shortness breath and weakness.  EXAM: CHEST - 1 VIEW  COMPARISON:  08/27/2013  FINDINGS: Single view of the chest demonstrates chronic interstitial lung densities. Evidence for calcified granulomas at the right lung base. Heart size is mildly enlarged and unchanged. Old left rib fractures. Trachea is midline. Plate and screw fixation in the proximal right humerus.  IMPRESSION: Chronic  interstitial lung changes without acute findings.  Mild cardiomegaly.   Electronically Signed   By: Markus Daft M.D.   On: 08/28/2013 20:47   Dg Hip Complete Left  09/13/2013   CLINICAL DATA:  Fall and left upper leg pain.  EXAM: LEFT HIP - COMPLETE 2+ VIEW  COMPARISON:  Abdominal CT 12/01/2012 and CT 08/29/2013  FINDINGS: There is concern for a fracture involving the left iliac wing. Pubic rami appear to be intact. The left hip is located without acute fracture. Mild degenerative changes in both hips.  IMPRESSION: There is concern for a fracture involving the left iliac wing. Recommend further characterization with a CT to further evaluate this area and evaluate for any additional injuries.  The left hip is located without acute fracture.   Electronically Signed   By: Markus Daft M.D.   On: 08/29/2013 20:52   Dg Femur Left  08/30/2013   CLINICAL DATA:  Fall and left upper leg pain.  EXAM: LEFT FEMUR - 2 VIEW  COMPARISON:  Left hip 08/29/2013  FINDINGS: There is a displaced fracture of  the left iliac wing. Left pubic rami appear to be intact. The left femur is intact. Left hip is located. Degenerative changes in left knee. There may be an old injury in the proximal left fibula.  IMPRESSION: Fracture of the left iliac wing.  The left femur is intact.   Electronically Signed   By: Markus Daft M.D.   On: 08/19/2013 20:55    Review of Systems  Musculoskeletal:       Left hip pain  All other systems reviewed and are negative.   Blood pressure 176/83, pulse 90, temperature 98.7 F (37.1 C), temperature source Oral, resp. rate 22, height _0  (1.727 m), weight 74 kg (163 lb 2.3 oz), SpO2 93.00%. Physical Exam  Constitutional: He appears well-developed and well-nourished.  HENT:  Head: Normocephalic and atraumatic.  Eyes: EOM are normal.  Neck: Normal range of motion.  Cardiovascular: Intact distal pulses.   Respiratory: Effort normal.  Musculoskeletal:  Left hip TTP to illiac crest No pain with IR  ER of hip Wiggles toes, distally NVI  Neurological: He is alert.  Skin: Skin is warm and dry.  Psychiatric: He has a normal mood and affect.    Assessment/Plan: comminuted and mildly displace left iliac wing fractures s/p fall 08/21/2013 Pain mgmt per primary team  Will treat nonoperatively  WBAT PT/OT to help him get up and teach ambulation/ADLs Follow up with Dr. Tamera Punt in 3 weeks at [facility] for repeat xrays and check up   [name] Flynn 09/16/2013, 12:23 PM

## 2013-09-17 ENCOUNTER — Inpatient Hospital Stay (HOSPITAL_COMMUNITY): Payer: Medicare Other

## 2013-09-17 DIAGNOSIS — N179 Acute kidney failure, unspecified: Secondary | ICD-10-CM

## 2013-09-17 DIAGNOSIS — G9340 Encephalopathy, unspecified: Secondary | ICD-10-CM | POA: Diagnosis not present

## 2013-09-17 DIAGNOSIS — N184 Chronic kidney disease, stage 4 (severe): Secondary | ICD-10-CM | POA: Diagnosis not present

## 2013-09-17 LAB — CBC
HEMATOCRIT: 22.7 % — AB (ref 39.0–52.0)
Hemoglobin: 7.5 g/dL — ABNORMAL LOW (ref 13.0–17.0)
MCH: 32.5 pg (ref 26.0–34.0)
MCHC: 33 g/dL (ref 30.0–36.0)
MCV: 98.3 fL (ref 78.0–100.0)
Platelets: 200 10*3/uL (ref 150–400)
RBC: 2.31 MIL/uL — AB (ref 4.22–5.81)
RDW: 17 % — ABNORMAL HIGH (ref 11.5–15.5)
WBC: 15.3 10*3/uL — AB (ref 4.0–10.5)

## 2013-09-17 LAB — BASIC METABOLIC PANEL
BUN: 60 mg/dL — ABNORMAL HIGH (ref 6–23)
CO2: 16 meq/L — AB (ref 19–32)
CREATININE: 4.36 mg/dL — AB (ref 0.50–1.35)
Calcium: 8.2 mg/dL — ABNORMAL LOW (ref 8.4–10.5)
Chloride: 104 mEq/L (ref 96–112)
GFR calc Af Amer: 13 mL/min — ABNORMAL LOW (ref >90)
GFR calc non Af Amer: 11 mL/min — ABNORMAL LOW (ref >90)
Glucose, Bld: 148 mg/dL — ABNORMAL HIGH (ref 70–99)
POTASSIUM: 4.9 meq/L (ref 3.7–5.3)
Sodium: 137 mEq/L (ref 137–147)

## 2013-09-17 LAB — GLUCOSE, CAPILLARY
GLUCOSE-CAPILLARY: 198 mg/dL — AB (ref 70–99)
GLUCOSE-CAPILLARY: 139 mg/dL — AB (ref 70–99)
Glucose-Capillary: 139 mg/dL — ABNORMAL HIGH (ref 70–99)
Glucose-Capillary: 171 mg/dL — ABNORMAL HIGH (ref 70–99)

## 2013-09-17 MED ORDER — CLINDAMYCIN PHOSPHATE 600 MG/50ML IV SOLN
600.0000 mg | Freq: Three times a day (TID) | INTRAVENOUS | Status: DC
  Administered 2013-09-17: 600 mg via INTRAVENOUS
  Filled 2013-09-17 (×3): qty 50

## 2013-09-17 MED ORDER — TRAMADOL HCL 50 MG PO TABS: 50.0000 mg | ORAL_TABLET | Freq: Two times a day (BID) | ORAL | Status: DC | PRN

## 2013-09-17 MED ORDER — SODIUM BICARBONATE 650 MG PO TABS
650.0000 mg | ORAL_TABLET | Freq: Two times a day (BID) | ORAL | Status: DC
  Filled 2013-09-17 (×4): qty 1

## 2013-09-17 MED ORDER — HYDRALAZINE HCL 25 MG PO TABS
25.0000 mg | ORAL_TABLET | Freq: Three times a day (TID) | ORAL | Status: DC | PRN
  Filled 2013-09-17: qty 1

## 2013-09-17 MED ORDER — TRAMADOL-ACETAMINOPHEN 37.5-325 MG PO TABS: 1.0000 | ORAL_TABLET | Freq: Four times a day (QID) | ORAL | Status: DC | PRN

## 2013-09-17 NOTE — Evaluation (Signed)
SLP Cancellation Note  Patient Details Name: TERRAL COOKS MRN: [id number] DOB: 1924-07-18   Cancelled treatment:       Reason Eval/Treat Not Completed: Patient at procedure or test/unavailable (per PT note, pt very lethargic.  SLP will return as schedule allows or next date in am.  )   Luanna Salk, [facility] SLP [phone]

## 2013-09-17 NOTE — Progress Notes (Signed)
OT Cancellation Note  Patient Details Name: [name] Flynn MRN: [id number] DOB: [birth year]   Cancelled Treatment:    Reason Eval/Treat Not Completed: Other (comment).  Pt is lethargic and not ready for OT at this time.  Will likely defer OT to snf setting.  Kyriaki Moder 09/17/2013, 1:47 PM Lesle Chris, OTR/L [phone] 09/17/2013

## 2013-09-17 NOTE — Progress Notes (Signed)
[facility] PROGRESS NOTE  [name] Flynn [id number] DOB: [birth year] DOA: 09/16/2013 PCP: Criselda Peaches, MD Brief narrative 80 year old male with history of type 2 diabetes mellitus, hypertension, coronary artery disease, mild dementia resident of independent living, presented to the ED after sustaining a mechanical fall with fracture of left iliac wing.  Assessment/Plan: Fracture of the left iliac wing CT scan of pelvis showed a comminuted and mildly displaced left iliac bone fracture. Plan on conservative management including pain control and weightbearing as tolerated. PT/OT consulted and recommended for skilled nursing facility.Marland Kitchen Appreciate orthopedic consult . followup as outpatient in 2 weeks. -Will change pain regimen given lethargy  Altered mental status -Patient appears more lethargic today. Given history of fall at home and possible head injury sheet I will obtain a head CT. Also at high risk for aspiration. I would keep him n.p.o. and get a swallow eval. also order chest x-ray to rule out aspiration. Continue Rocephin. -Patient is on morphine and oxycodone for pain and I will simplify it to ultracet as needed ( Patient has not received any narcotics since yesterday)  Hyperkalemia Patient given D50 with 10 units insulin on admission. Resolved   UTI On empiric Rocephin. Culture growing enterococcus. Follow sensitivity  Acute on chronic kidney disease stage IV Baseline creatinine between 3.5-3.7 renal  function worsened this morning. Also noted some metabolic acidosis. Added bicarbonate. Check bladder scan.  Type 2 diabetes mellitus Continue with sliding scale insulin  Hypertension Resume home dose atenolol   Anemia of chronic disease Hemoglobin at baseline  Diet: Diabetic DVT prophylaxis: Subcutaneous heparin  Code Status: Full code Family Communication: Daughter at bedside  Disposition Plan: Skilled nursing  facility   Consultants:  Orthopedics  Procedures:  None  Antibiotics:  IV Rocephin  HPI/Subjective: Patient seen and examined this morning. He appears more lethargic although oriented  Objective: Filed Vitals:   09/17/13 0645  BP: 154/74  Pulse: 91  Temp: 98.2 F (36.8 C)  Resp: 20    Intake/Output Summary (Last 24 hours) at 09/17/13 1326 Last data filed at 09/17/13 1211  Gross per 24 hour  Intake   1610 ml  Output    100 ml  Net   1510 ml   Filed Weights   08/23/2013 2300  Weight: 74 kg (163 lb 2.3 oz)    Exam:   General:  Elderly male lying in bed in no acute distress, appears confused  HEENT: No pallor, moist oral mucosa  Chest: Clear to auscultation bilaterally, sounds  CVS: Normal S1 and S2, no murmurs rub or gallop  Abdomen: Soft, nontender, nondistended, bowel sounds within,  Extremities: Limited range of motion over  left hip and tender to palpation  CNS: appears lethargic, arousable to commands and oriented to place and person  Data Reviewed: Basic Metabolic Panel:  Recent Labs Lab 09/09/2013 1925 09/16/13 0505 09/17/13 0433  NA 131* 132* 137  K 5.7* 4.9 4.9  CL 99 99 104  CO2 17* 18* 16*  GLUCOSE 182* 103* 148*  BUN 59* 58* 60*  CREATININE 3.52* 3.72* 4.36*  CALCIUM 8.7 8.4 8.2*   Liver Function Tests: No results found for this basename: AST, ALT, ALKPHOS, BILITOT, PROT, ALBUMIN,  in the last 168 hours No results found for this basename: LIPASE, AMYLASE,  in the last 168 hours No results found for this basename: AMMONIA,  in the last 168 hours CBC:  Recent Labs Lab 09/17/2013 1925 09/16/13 0505 09/17/13 0433  WBC 14.7* 14.6* 15.3*  NEUTROABS  13.3*  --   --   HGB 8.4* 7.8* 7.5*  HCT 25.5* 24.3* 22.7*  MCV 97.0 98.0 98.3  PLT 226 217 200   Cardiac Enzymes: No results found for this basename: CKTOTAL, CKMB, CKMBINDEX, TROPONINI,  in the last 168 hours BNP (last 3 results) No results found for this basename: PROBNP,  in  the last 8760 hours CBG:  Recent Labs Lab 09/16/13 0725 09/16/13 2248 09/17/13 0831 09/17/13 1143  GLUCAP 120* 155* 139* 198*    Recent Results (from the past 240 hour(s))  URINE CULTURE     Status: None   Collection Time    09/16/13  5:50 AM      Result Value Range Status   Specimen Description URINE, CLEAN CATCH   Final   Special Requests NONE   Final   Culture  Setup Time     Final   Value: 09/16/2013 09:45     Performed at [facility]     Final   Value: >=100,000 COLONIES/ML     Performed at [facility]   Culture     Final   Value: ENTEROCOCCUS SPECIES     Performed at [facility]   Report Status PENDING   Incomplete     Studies: Ct Abdomen Pelvis Wo Contrast  09/06/2013   CLINICAL DATA:  Fall, iliac fracture  EXAM: CT ABDOMEN AND PELVIS WITHOUT CONTRAST  TECHNIQUE: Multidetector CT imaging of the abdomen and pelvis was performed following the standard protocol without intravenous contrast.  COMPARISON:  08/29/2013  FINDINGS: Cardiomegaly. Low attenuation of the blood pool suggests anemia. Mild aortic valvular and coronary artery calcifications. Small pericardial effusion. Tiny hiatal hernia. Calcified granuloma within the right greater in the left lower lobes. Peripheral reticular opacities/fibrosis.  Organ evaluation limited without intravenous contrast. Within this limitation, no appreciable abnormality of the liver. Cholecystectomy. No biliary ductal dilatation. No appreciable abnormality of the pancreas, spleen, adrenal glands.  Bilateral renal sinus masses encase the renal pelves and extend into the perinephric fat on the left greater than right, similar to prior. No overt hydroureteronephrosis.  Extensive colonic diverticulosis. No CT evidence for colitis or diverticulitis. No bowel obstruction. No free intraperitoneal air or fluid. Peritoneal implant versus enlarged lymph node on image 63 of 91 is unchanged.  Advanced  atherosclerotic disease of the aorta and branch vessels with areas of ectasia. Mild dilatation of the right common iliac artery up to 1.5 cm.  Thin walled bladder. Prostatomegaly at 6.1 cm transverse diameter. Fat containing right greater than left inguinal hernias.  Diffuse osteopenia. Mildly comminuted and displaced left iliac bone fractures. The femoral heads remain seated within the acetabula. No overt SI joint extension or displaced sacral fracture identified.  IMPRESSION: Comminuted and mildly displaced left iliac bone fractures.  Other unchanged findings as above.   Electronically Signed   By: Carlos Levering M.D.   On: 08/20/2013 22:23   Dg Chest 1 View  09/06/2013   CLINICAL DATA:  Fall.  Shortness breath and weakness.  EXAM: CHEST - 1 VIEW  COMPARISON:  08/27/2013  FINDINGS: Single view of the chest demonstrates chronic interstitial lung densities. Evidence for calcified granulomas at the right lung base. Heart size is mildly enlarged and unchanged. Old left rib fractures. Trachea is midline. Plate and screw fixation in the proximal right humerus.  IMPRESSION: Chronic interstitial lung changes without acute findings.  Mild cardiomegaly.   Electronically Signed   By: Scherrie Gerlach.D.  On: 08/20/2013 20:47   Dg Hip Complete Left  08/19/2013   CLINICAL DATA:  Fall and left upper leg pain.  EXAM: LEFT HIP - COMPLETE 2+ VIEW  COMPARISON:  Abdominal CT 12/01/2012 and CT 08/29/2013  FINDINGS: There is concern for a fracture involving the left iliac wing. Pubic rami appear to be intact. The left hip is located without acute fracture. Mild degenerative changes in both hips.  IMPRESSION: There is concern for a fracture involving the left iliac wing. Recommend further characterization with a CT to further evaluate this area and evaluate for any additional injuries.  The left hip is located without acute fracture.   Electronically Signed   By: Markus Daft M.D.   On: 09/06/2013 20:52   Dg Femur  Left  09/16/2013   CLINICAL DATA:  Fall and left upper leg pain.  EXAM: LEFT FEMUR - 2 VIEW  COMPARISON:  Left hip 09/04/2013  FINDINGS: There is a displaced fracture of the left iliac wing. Left pubic rami appear to be intact. The left femur is intact. Left hip is located. Degenerative changes in left knee. There may be an old injury in the proximal left fibula.  IMPRESSION: Fracture of the left iliac wing.  The left femur is intact.   Electronically Signed   By: Markus Daft M.D.   On: 09/17/2013 20:55    Scheduled Meds: . aspirin  81 mg Oral q morning - 10a  . atenolol  50 mg Oral q morning - 10a  . atorvastatin  20 mg Oral q1800  . cefTRIAXone (ROCEPHIN)  IV  1 g Intravenous Q24H  . docusate sodium  100 mg Oral BID  . heparin  5,000 Units Subcutaneous Q8H  . insulin aspart  0-15 Units Subcutaneous TID WC  . insulin aspart  0-5 Units Subcutaneous QHS  . pantoprazole  40 mg Oral Daily  . sodium bicarbonate  650 mg Oral BID  . tamsulosin  0.4 mg Oral q morning - 10a   Continuous Infusions: . sodium chloride 75 mL/hr at 09/17/13 0032      Time spent: 25 minutes    Chemere Steffler  [facility] Pager [phone] If 7PM-7AM, please contact night-coverage at [email or web address], password [facility] 09/17/2013, 1:26 PM  LOS: 2 days

## 2013-09-17 NOTE — Progress Notes (Signed)
ANTIBIOTIC CONSULT NOTE - INITIAL  Pharmacy Consult for Clindamycin Indication: rule out pneumonia (aspiration)  No Known Allergies  Patient Measurements: Height: 5\' 8"  (172.7 cm) Weight: 163 lb 2.3 oz (74 kg) IBW/kg (Calculated) : 68.4   Vital Signs: Temp: 98.2 F (36.8 C) (01/30 1457) Temp src: Oral (01/30 1457) BP: 156/69 mmHg (01/30 1457) Pulse Rate: 92 (01/30 1457) Intake/Output from previous day: 01/29 0701 - 01/30 0700 In: 2022.5 [P.O.:170; I.V.:1802.5; IV Piggyback:50] Out: 100 [Urine:100]  Labs:  Recent Labs  09/12/2013 1925 09/16/13 0505 09/17/13 0433  WBC 14.7* 14.6* 15.3*  HGB 8.4* 7.8* 7.5*  PLT 226 217 200  CREATININE 3.52* 3.72* 4.36*   Estimated Creatinine Clearance: 11.5 ml/min (by C-G formula based on Cr of 4.36).   Microbiology: Recent Results (from the past 720 hour(s))  MRSA PCR SCREENING     Status: None   Collection Time    08/26/13  5:10 AM      Result Value Range Status   MRSA by PCR NEGATIVE  NEGATIVE Final   Comment:            The GeneXpert MRSA Assay (FDA     approved for NASAL specimens     only), is one component of a     comprehensive MRSA colonization     surveillance program. It is not     intended to diagnose MRSA     infection nor to guide or     monitor treatment for     MRSA infections.  URINE CULTURE     Status: None   Collection Time    09/16/13  5:50 AM      Result Value Range Status   Specimen Description URINE, CLEAN CATCH   Final   Special Requests NONE   Final   Culture  Setup Time     Final   Value: 09/16/2013 09:45     Performed at [facility]     Final   Value: >=100,000 COLONIES/ML     Performed at [facility]   Culture     Final   Value: ENTEROCOCCUS SPECIES     Performed at [facility]   Report Status PENDING   Incomplete    Medical History: Past Medical History  Diagnosis Date  . Diabetes mellitus   . Hypertension   . Pancreatitis   . CAD  (coronary artery disease)   . Hyperlipemia   . Anemia     macrocytic  . DM type 2 causing renal disease   . Chronic kidney disease, stage IV (severe)     Medications:  Anti-infectives   Start     Dose/Rate Route Frequency Ordered Stop   09/17/13 1615  clindamycin (CLEOCIN) IVPB 600 mg     600 mg 100 mL/hr over 30 Minutes Intravenous Every 8 hours 09/17/13 1604     09/02/2013 2359  cefTRIAXone (ROCEPHIN) 1 g in dextrose 5 % 50 mL IVPB     1 g 100 mL/hr over 30 Minutes Intravenous Every 24 hours 08/27/2013 2323       Assessment: 68 yoM presented to [facility] ED on 1/[facility] with fracture of left iliac wing.  Ceftriaxone was started 1/28 for UTI.  Pharmacy is consulted to dose clindamycin for new concern of aspiration pneumonia.  Tmax: afebrile  WBCs: 15.3  Renal: SCr 4.36 (increasing), CrCl ~ 11  Goal of Therapy:  Appropriate abx dosing, eradication of  infection.   Plan:  Clindamycin 600mg  IV q8h Continue ceftriaxone 1g IV q24h Follow up renal fxn and culture results.   Gretta Arab PharmD, BCPS Pager [phone] 09/17/2013 4:08 PM

## 2013-09-18 DIAGNOSIS — J9600 Acute respiratory failure, unspecified whether with hypoxia or hypercapnia: Secondary | ICD-10-CM

## 2013-09-18 DIAGNOSIS — N189 Chronic kidney disease, unspecified: Secondary | ICD-10-CM

## 2013-09-18 DIAGNOSIS — J690 Pneumonitis due to inhalation of food and vomit: Secondary | ICD-10-CM

## 2013-09-18 LAB — CBC WITH DIFFERENTIAL/PLATELET
BASOS ABS: 0 10*3/uL (ref 0.0–0.1)
BASOS PCT: 0 % (ref 0–1)
EOS ABS: 0 10*3/uL (ref 0.0–0.7)
Eosinophils Relative: 0 % (ref 0–5)
HEMATOCRIT: 19.4 % — AB (ref 39.0–52.0)
Hemoglobin: 6.3 g/dL — CL (ref 13.0–17.0)
Lymphocytes Relative: 27 % (ref 12–46)
Lymphs Abs: 3.5 10*3/uL (ref 0.7–4.0)
MCH: 33 pg (ref 26.0–34.0)
MCHC: 32.5 g/dL (ref 30.0–36.0)
MCV: 101.6 fL — AB (ref 78.0–100.0)
MONO ABS: 0.5 10*3/uL (ref 0.1–1.0)
Monocytes Relative: 3 % (ref 3–12)
Neutro Abs: 9.2 10*3/uL — ABNORMAL HIGH (ref 1.7–7.7)
Neutrophils Relative %: 70 % (ref 43–77)
Platelets: 153 10*3/uL (ref 150–400)
RBC: 1.91 MIL/uL — ABNORMAL LOW (ref 4.22–5.81)
RDW: 17.4 % — AB (ref 11.5–15.5)
WBC: 13.1 10*3/uL — ABNORMAL HIGH (ref 4.0–10.5)

## 2013-09-18 LAB — COMPREHENSIVE METABOLIC PANEL
ALBUMIN: 1.9 g/dL — AB (ref 3.5–5.2)
ALT: 1440 U/L — ABNORMAL HIGH (ref 0–53)
AST: 2252 U/L — AB (ref 0–37)
Alkaline Phosphatase: 77 U/L (ref 39–117)
BUN: 67 mg/dL — ABNORMAL HIGH (ref 6–23)
CO2: 13 mEq/L — ABNORMAL LOW (ref 19–32)
Calcium: 8 mg/dL — ABNORMAL LOW (ref 8.4–10.5)
Chloride: 108 mEq/L (ref 96–112)
Creatinine, Ser: 4.8 mg/dL — ABNORMAL HIGH (ref 0.50–1.35)
GFR calc Af Amer: 11 mL/min — ABNORMAL LOW (ref >90)
GFR calc non Af Amer: 10 mL/min — ABNORMAL LOW (ref >90)
Glucose, Bld: 243 mg/dL — ABNORMAL HIGH (ref 70–99)
Potassium: 5.7 mEq/L — ABNORMAL HIGH (ref 3.7–5.3)
Sodium: 143 mEq/L (ref 137–147)
Total Bilirubin: 0.4 mg/dL (ref 0.3–1.2)
Total Protein: 6.2 g/dL (ref 6.0–8.3)

## 2013-09-18 LAB — URINE CULTURE: Colony Count: >=100000

## 2013-09-18 LAB — GLUCOSE, CAPILLARY: GLUCOSE-CAPILLARY: 218 mg/dL — AB (ref 70–99)

## 2013-09-18 LAB — TROPONIN I: Troponin I: <0.3 ng/mL (ref <0.30)

## 2013-09-18 MED ORDER — INSULIN ASPART 100 UNIT/ML ~~LOC~~ SOLN: 0.0000 [IU] | SUBCUTANEOUS | Status: DC

## 2013-09-18 MED ORDER — NOREPINEPHRINE BITARTRATE 1 MG/ML IJ SOLN
2.0000 ug/min | INTRAVENOUS | Status: DC
  Filled 2013-09-18: qty 4

## 2013-09-18 MED FILL — Medication: Qty: 1 | Status: AC

## 2013-09-19 NOTE — Progress Notes (Signed)
Pt time of death 20 pronounced per Dr Ernie Hew CCM after several rounds of CPR amd ACLS drugs in ICU following code blue on 6 [place]. Family notified while pt upstairs on [address], arrived to unit and updated on pt status and expiration. Per family, pt had nothing of value that he brought to the hospital. Pair of black eyeglasses found, sent with body to morgue. Medical Examiner Jeanette Caprice contacted, to see body once in morgue. [facility] notified referral # [phone], not suitable for donation af any kind. [facility] NP Jonette Eva at bedside to complete Death Certificate. Body prepared for [facility].

## 2013-09-19 NOTE — Progress Notes (Signed)
Cardiopulmonary Resuscitation (CPR) Procedure Note Directed/Performed by: Delora Fuel I personally directed ancillary staff and/or performed CPR in an effort to regain return of spontaneous circulation and to maintain cardiac, neuro and systemic perfusion.   Patient was observed to be sleeping soundly and actually snoring. 20 minutes later, he was found to be apneic and pulseless and CPR was started and blue called. On arrival, CPR was being done and a cardiac monitor showed asystole. Anesthesia was present and intubated the patient. He was given 2 amps of epinephrine and was noted to have progressed into a coarse ventricular fibrillation. He was defibrillated and went into a pulseless electrical activity rhythm. Is given an additional amp of epinephrine and an amp of bicarbonate. Rhythm deteriorated to ventricular fibrillation he was defibrillated 2 more times. With elastic fibrillation, he initially was in asystole with occasional escape beats but these became more frequent and 7 and the study rhythm at about 95 beats per minute with palpable pulses. He was noted to be hypotensive with blood pressure around 80. He was being given IV fluids wide open and he was started on norepinephrine drip. Dr. Arnoldo Morale of [facility] was present and took over his care as he was being transferred to intensive care unit.

## 2013-09-19 NOTE — Progress Notes (Signed)
While doing routine patient check.  Patient noticed without respirations.  Code Blue called and CPR initiated until taken over by rapid response team.Assisted with patient transport to ICU for further treatment.

## 2013-09-19 DEATH — deceased

## 2013-09-20 NOTE — Progress Notes (Signed)
CARE MANAGEMENT NOTE 09/20/2013  Patient:  GRIGOR, LIPSCHUTZ   Account Number:  [id number]  Date Initiated:  09/20/2013  Documentation initiated by:  DAVIS,RHONDA  Subjective/Objective Assessment:   pt with hx of ams and fall suistained hip fracture, admitted and arrested without positive result from resuscitation.     Action/Plan:   noe patient expired   Anticipated DC Date:     Anticipated DC Plan:  EXPIRED  In-house referral  NA      DC Planning Services  NA      PAC Choice  NA   Choice offered to / List presented to:  NA   DME arranged  NA      DME agency  NA     [place] arranged  NA      [place] agency  NA   Status of service:  Completed, signed off Medicare Important Message given?  NA - LOS <3 / Initial given by admissions (If response is "NO", the following Medicare IM given date fields will be blank) Date Medicare IM given:   Date Additional Medicare IM given:    Discharge Disposition:  EXPIRED  Per UR Regulation:  Reviewed for med. necessity/level of care/duration of stay  If discussed at [place] of Stay Meetings, dates discussed:    Comments:  [id number]/Rhonda Eldridge Dace, BSN, [place] [phone] Chart Reviewed for discharge and hospital needs. Discharge needs at time of review:  None present will follow for needs. Review of patient progress due on [id number].

## 2013-09-29 DIAGNOSIS — J690 Pneumonitis due to inhalation of food and vomit: Secondary | ICD-10-CM | POA: Diagnosis present

## 2013-09-29 NOTE — Discharge Summary (Signed)
Physician Discharge Summary  [name] Flynn ZDG:[id number] DOB: [birth year] DOA: 08/24/2013  PCP: Criselda Peaches, MD  Admit date: 08/30/2013 Discharge date: 09/29/2013  Time spent: 30 minutes   Discharge Diagnoses:   Active Problems:   Hypertension   DM type 2 causing renal disease   Chronic kidney disease, stage IV (severe)   Fracture of iliac wing   Fall   Hyperkalemia   Fracture of left iliac wing   Acute on chronic kidney disease, stage 4   Acute encephalopathy Acute hypoxic respiratory failure   Discharge Condition: patient expired on 09/26/13   Filed Weights   08/30/2013 2300 2013-09-26 0300  Weight: 74 kg (163 lb 2.3 oz) 74 kg (163 lb 2.3 oz)    History of present illness:  80 year old male with history of type 2 diabetes mellitus, hypertension, coronary artery disease, mild dementia resident of independent living, presented to the ED after sustaining a mechanical fall with fracture of left iliac wing.   Hospital Course:  Fracture of the left iliac wing  CT scan of pelvis showed a comminuted and mildly displaced left iliac bone fracture. Plan on conservative management including pain control and weightbearing as tolerated. PT/OT consulted and recommended for skilled nursing facility.Marland Kitchen Appreciate orthopedic consult . recommended followup as outpatient in 2 weeks.   Altered mental status  -Patient appears more lethargic on 1/30. Given history of fall at home and possible head injury n a head CT was done which was unremarkable.. Also at high risk for aspiration. I would keep him n.p.o. Ordered a swallow eval. chest x-ray to rule out aspiration showed new right sided infiltrate. Added clindamycin for possible aspiration pneumonia and continued rocephin. Pain meds were held due to lethargy.  on the morning of 1/31 at around 1 am patient was sleeping quietly and snoring , however about 20 minutes later he was found to be apneic and pulseless. Code blue was called and CPR  initiated. Patient was noted to be in asystole in the monitor . patient was intubated at bedside. patient was hypotensive and given epinephrine and IV bicarbonate. Patient went into v fib and was defibrillated twice. Patient did not respond to the ACLS treatment and was pronounced dead at 1:34 am. Family was notified.  Hyperkalemia  Patient given D50 with 10 units insulin on admission. Resolved   UTI  Placed On empiric Rocephin. Culture growing enterococcus.  Acute on chronic kidney disease stage IV  Baseline creatinine between 3.5-3.7 renal function worsened on 1/30. Also noted some metabolic acidosis. Added bicarbonate.   Type 2 diabetes mellitus  Continue with sliding scale insulin   Anemia of chronic disease       Consultants:  Orthopedics Procedures:  None Antibiotics:  IV Rocephin  Discharge Exam: Filed Vitals:   09/26/2013 0125  BP: 40/20  Temp:   Resp:      Discharge Instructions                                No Known Allergies    The results of significant diagnostics from this hospitalization (including imaging, microbiology, ancillary and laboratory) are listed below for reference.    Significant Diagnostic Studies: Ct Abdomen Pelvis Wo Contrast  08/27/2013   CLINICAL DATA:  Fall, iliac fracture  EXAM: CT ABDOMEN AND PELVIS WITHOUT CONTRAST  TECHNIQUE: Multidetector CT imaging of the abdomen and pelvis was performed following the standard protocol without intravenous contrast.  COMPARISON:  08/29/2013  FINDINGS: Cardiomegaly. Low attenuation of the blood pool suggests anemia. Mild aortic valvular and coronary artery calcifications. Small pericardial effusion. Tiny hiatal hernia. Calcified granuloma within the right greater in the left lower lobes. Peripheral reticular opacities/fibrosis.  Organ evaluation limited without intravenous contrast. Within this limitation, no appreciable abnormality of the liver. Cholecystectomy. No biliary ductal  dilatation. No appreciable abnormality of the pancreas, spleen, adrenal glands.  Bilateral renal sinus masses encase the renal pelves and extend into the perinephric fat on the left greater than right, similar to prior. No overt hydroureteronephrosis.  Extensive colonic diverticulosis. No CT evidence for colitis or diverticulitis. No bowel obstruction. No free intraperitoneal air or fluid. Peritoneal implant versus enlarged lymph node on image 63 of 91 is unchanged.  Advanced atherosclerotic disease of the aorta and branch vessels with areas of ectasia. Mild dilatation of the right common iliac artery up to 1.5 cm.  Thin walled bladder. Prostatomegaly at 6.1 cm transverse diameter. Fat containing right greater than left inguinal hernias.  Diffuse osteopenia. Mildly comminuted and displaced left iliac bone fractures. The femoral heads remain seated within the acetabula. No overt SI joint extension or displaced sacral fracture identified.  IMPRESSION: Comminuted and mildly displaced left iliac bone fractures.  Other unchanged findings as above.   Electronically Signed   By: Carlos Levering M.D.   On: 08/29/2013 22:23   Dg Chest 1 View  08/23/2013   CLINICAL DATA:  Fall.  Shortness breath and weakness.  EXAM: CHEST - 1 VIEW  COMPARISON:  08/27/2013  FINDINGS: Single view of the chest demonstrates chronic interstitial lung densities. Evidence for calcified granulomas at the right lung base. Heart size is mildly enlarged and unchanged. Old left rib fractures. Trachea is midline. Plate and screw fixation in the proximal right humerus.  IMPRESSION: Chronic interstitial lung changes without acute findings.  Mild cardiomegaly.   Electronically Signed   By: Markus Daft M.D.   On: 08/30/2013 20:47   Dg Hip Complete Left  09/10/2013   CLINICAL DATA:  Fall and left upper leg pain.  EXAM: LEFT HIP - COMPLETE 2+ VIEW  COMPARISON:  Abdominal CT 12/01/2012 and CT 08/29/2013  FINDINGS: There is concern for a fracture involving  the left iliac wing. Pubic rami appear to be intact. The left hip is located without acute fracture. Mild degenerative changes in both hips.  IMPRESSION: There is concern for a fracture involving the left iliac wing. Recommend further characterization with a CT to further evaluate this area and evaluate for any additional injuries.  The left hip is located without acute fracture.   Electronically Signed   By: Markus Daft M.D.   On: 09/03/2013 20:52   Dg Femur Left  09/08/2013   CLINICAL DATA:  Fall and left upper leg pain.  EXAM: LEFT FEMUR - 2 VIEW  COMPARISON:  Left hip 09/06/2013  FINDINGS: There is a displaced fracture of the left iliac wing. Left pubic rami appear to be intact. The left femur is intact. Left hip is located. Degenerative changes in left knee. There may be an old injury in the proximal left fibula.  IMPRESSION: Fracture of the left iliac wing.  The left femur is intact.   Electronically Signed   By: Markus Daft M.D.   On: 08/25/2013 20:55   Ct Head Wo Contrast  09/17/2013   CLINICAL DATA:  Fall.  Altered mental status  EXAM: CT HEAD WITHOUT CONTRAST  TECHNIQUE: Contiguous axial images were obtained from the base  of the skull through the vertex without intravenous contrast.  COMPARISON:  MRI 11/24/2012, CT 05/30/2012  FINDINGS: Moderate to advanced atrophy. Mild chronic microvascular ischemic change in the white matter.  Negative for acute infarct. Negative for hemorrhage or mass. No edema or shift of the midline structures.  Negative for skull fracture. Mild mucosal thickening in the paranasal sinuses.  IMPRESSION: Atrophy and chronic microvascular ischemia.  No acute abnormality.   Electronically Signed   By: Franchot Gallo M.D.   On: 09/17/2013 14:21   Dg Chest Port 1 View  09/17/2013   CLINICAL DATA:  Suspected aspiration  EXAM: PORTABLE CHEST - 1 VIEW  COMPARISON:  DG CHEST 1 VIEW dated 09/13/2013  FINDINGS: The lungs are well-expanded. The interstitial markings are diffusely increased  and are more conspicuous over the right lower hemithorax than on the earlier study. The cardiac silhouette is enlarged. The pulmonary vascularity is not engorged. The mediastinum is normal in width. There is no pleural effusion. Old deformities of upper left ribs are demonstrated.  IMPRESSION: Mildly increased density in the right lower hemi thorax is present and could reflect clinically suspected aspiration. There are increased interstitial markings elsewhere in both lungs not greatly changed from the previous study. These are likely related to congestive heart failure though interstitial pneumonia is not excluded.   Electronically Signed   By: David  Martin[place]   On: 09/17/2013 14:47    Microbiology: No results found for this or any previous visit (from the past 240 hour(s)).   Labs: Basic Metabolic Panel: No results found for this basename: NA, K, CL, CO2, GLUCOSE, BUN, CREATININE, CALCIUM, MG, PHOS,  in the last 168 hours Liver Function Tests: No results found for this basename: AST, ALT, ALKPHOS, BILITOT, PROT, ALBUMIN,  in the last 168 hours No results found for this basename: LIPASE, AMYLASE,  in the last 168 hours No results found for this basename: AMMONIA,  in the last 168 hours CBC: No results found for this basename: WBC, NEUTROABS, HGB, HCT, MCV, PLT,  in the last 168 hours Cardiac Enzymes: No results found for this basename: CKTOTAL, CKMB, CKMBINDEX, TROPONINI,  in the last 168 hours BNP: BNP (last 3 results) No results found for this basename: PROBNP,  in the last 8760 hours CBG: No results found for this basename: GLUCAP,  in the last 168 hours     Signed:  Louellen Molder  [facility] 09/29/2013, 6:06 PM

## 2013-10-17 DEATH — deceased

## 2013-12-07 ENCOUNTER — Other Ambulatory Visit: Payer: Medicare Other

## 2013-12-07 ENCOUNTER — Other Ambulatory Visit (HOSPITAL_COMMUNITY): Payer: Medicare Other

## 2013-12-10 ENCOUNTER — Ambulatory Visit: Payer: Medicare Other | Admitting: Oncology

## 2014-04-08 IMAGING — CR DG SHOULDER 2+V*R*
3 series · 3 of 3 positions shown · non-contrast
Comparison: None.

CLINICAL DATA: Fell today with right shoulder pain

RIGHT SHOULDER - 2+ VIEW

[w shoulder external right]
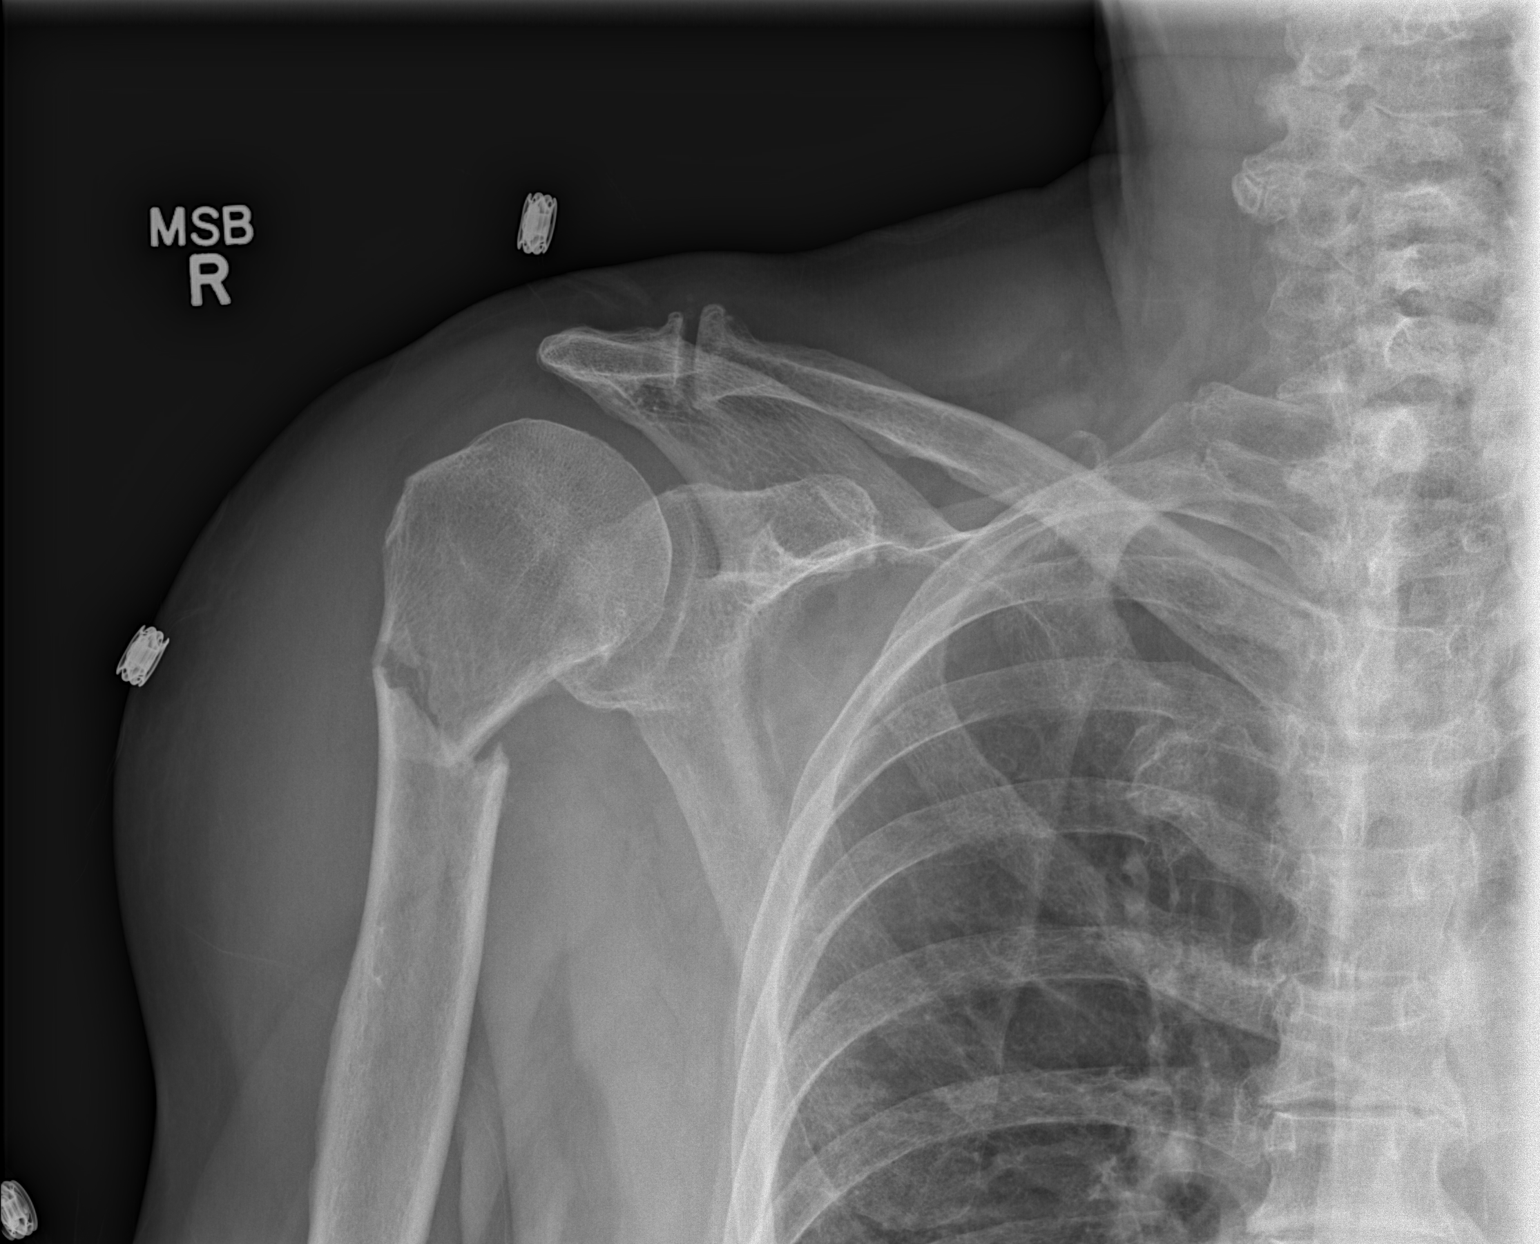

[w shoulder internal right]
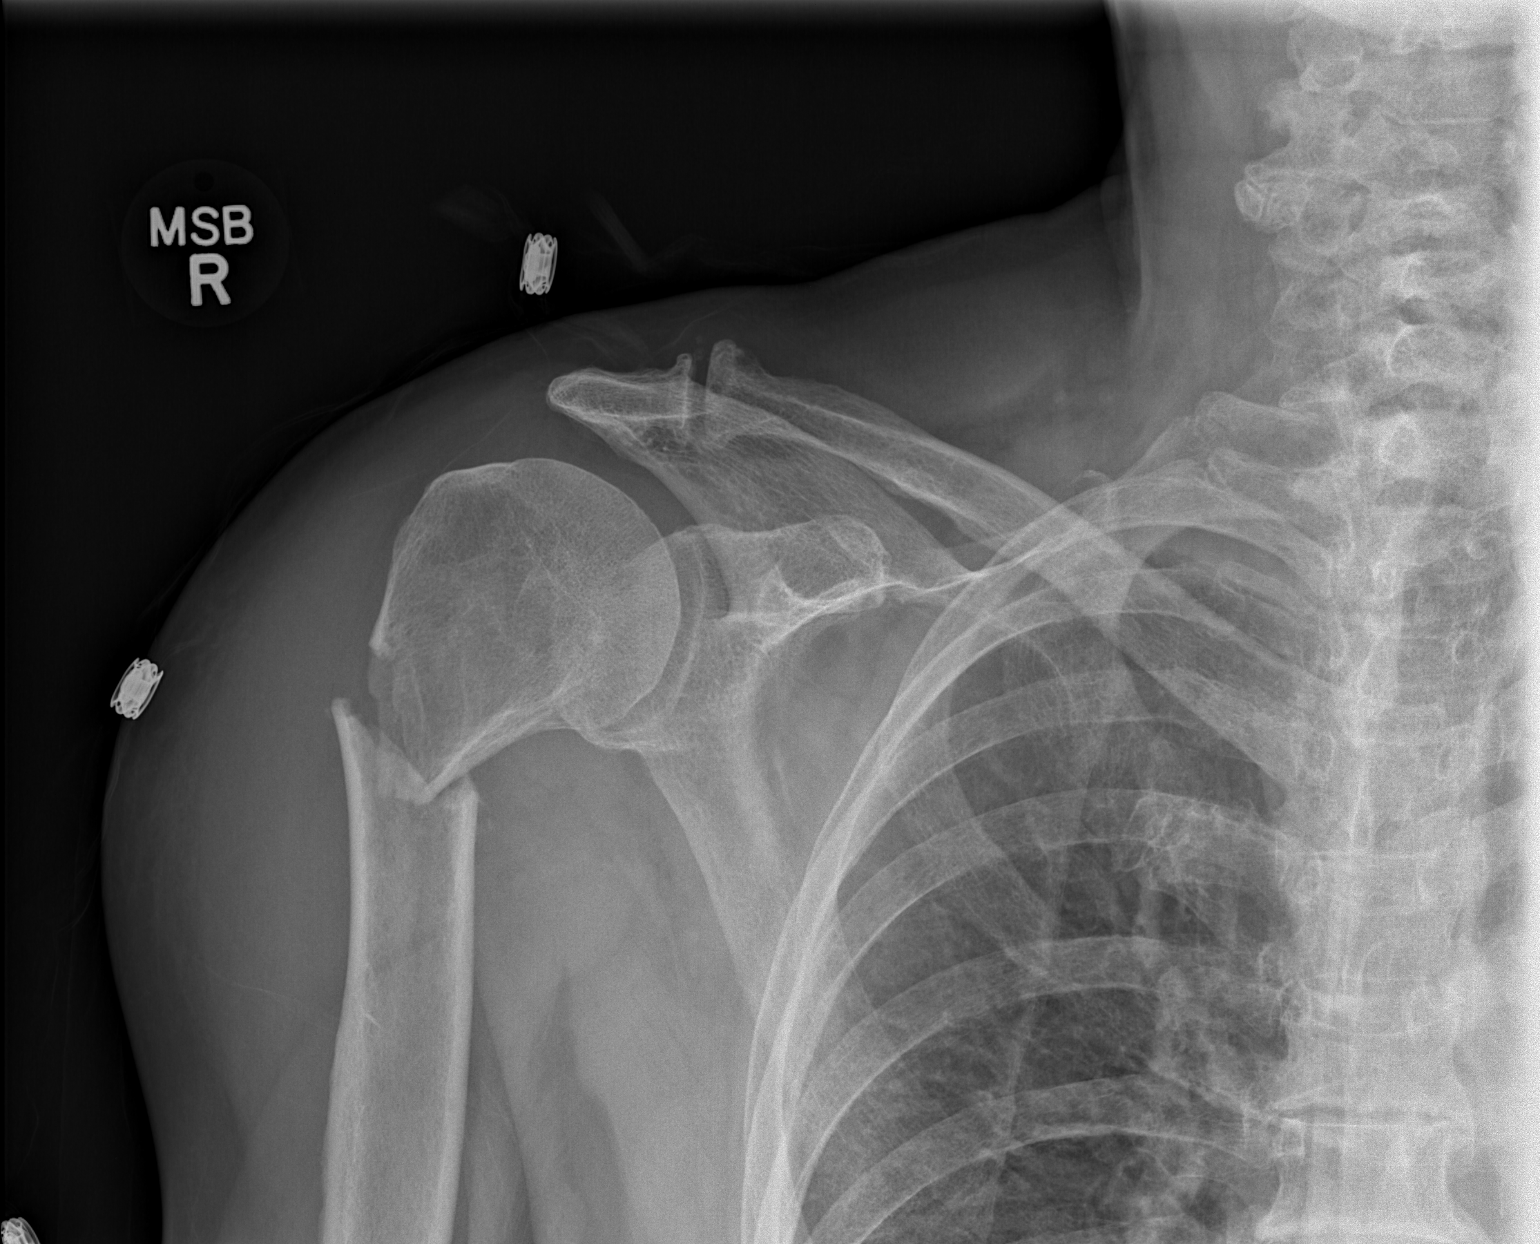

[w shoulder y-view right]
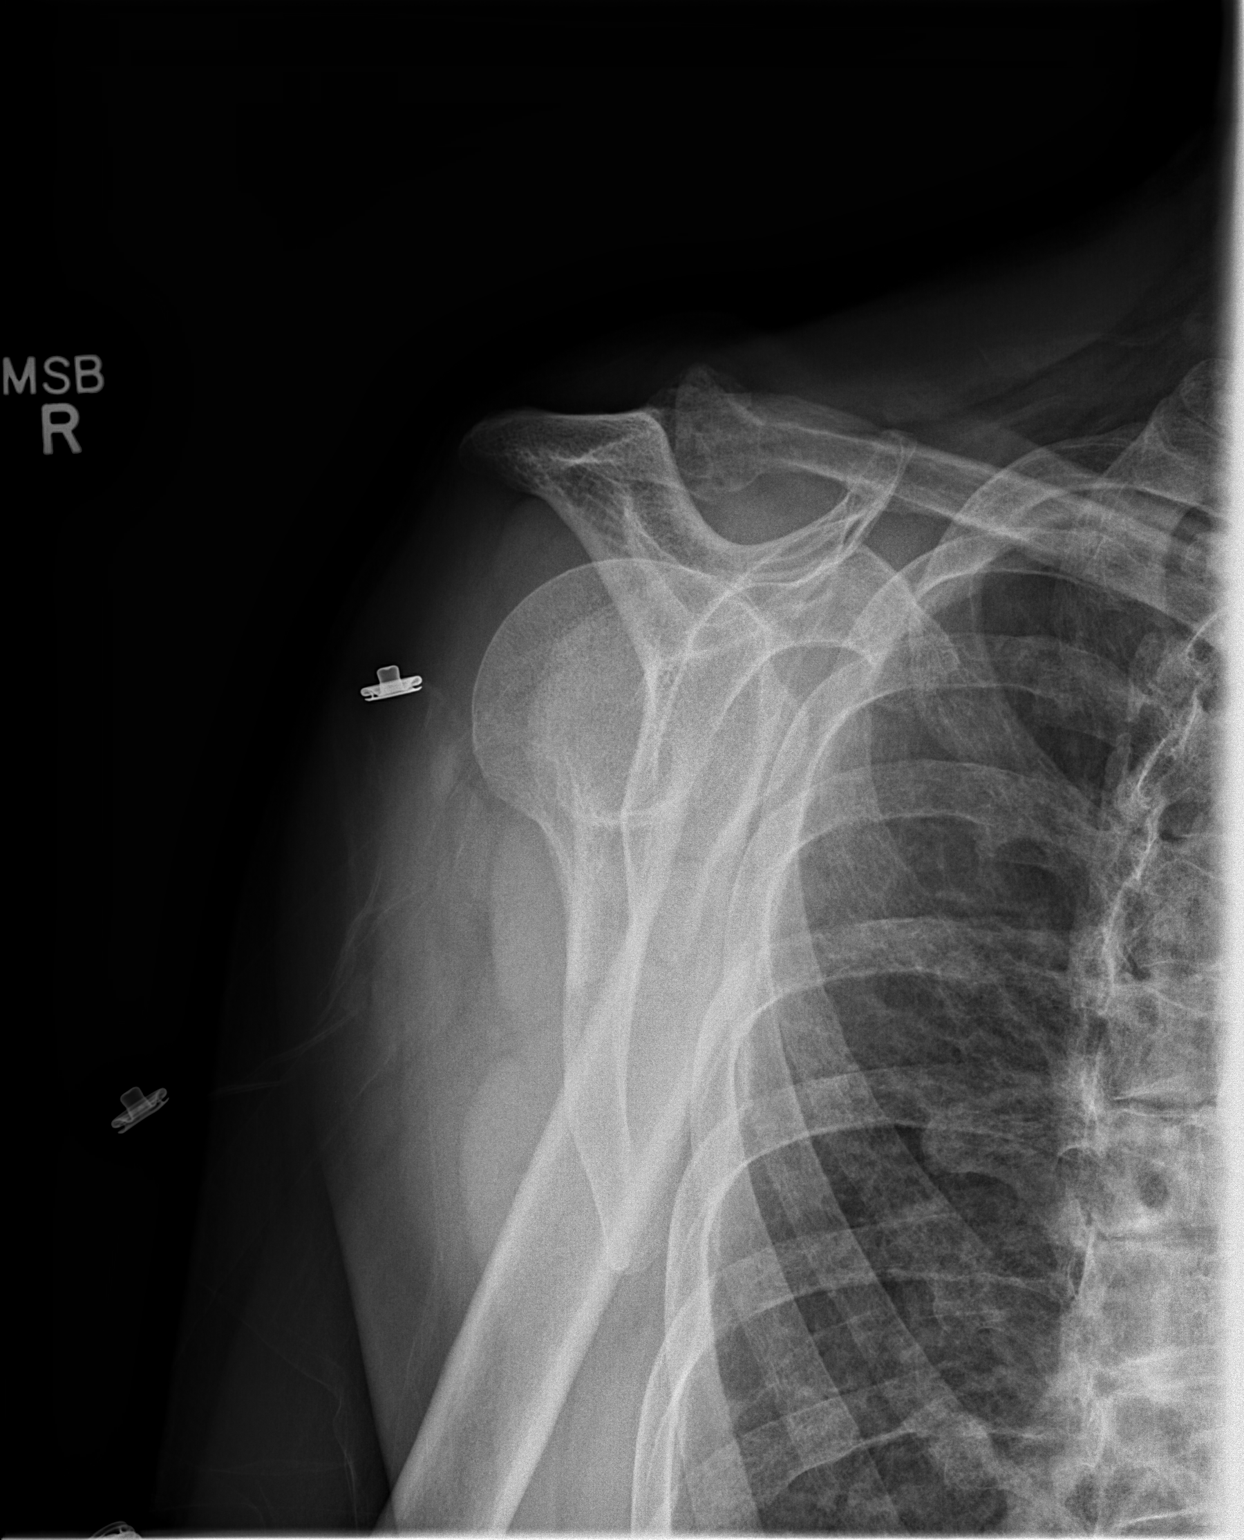

[3 of 3 positions shown; findings below may reference images not displayed]

FINDINGS: There is a transverse angulated fracture of the right
humeral neck - proximal humerus.  Degenerative change is present at
the right AC joint.  The ribs that are visualized appear intact.
IMPRESSION: Transverse angulated fracture of the right humeral neck - proximal
humerus.

## 2014-04-16 IMAGING — CR DG CHEST 2V
2 series · 2 of 2 positions shown · non-contrast
Comparison: 10/02/2010

CLINICAL DATA: Preop for numerous fixation.  Hypertension and
diabetes.  Nonsmoker.

CHEST - 2 VIEW

[w chest pa]
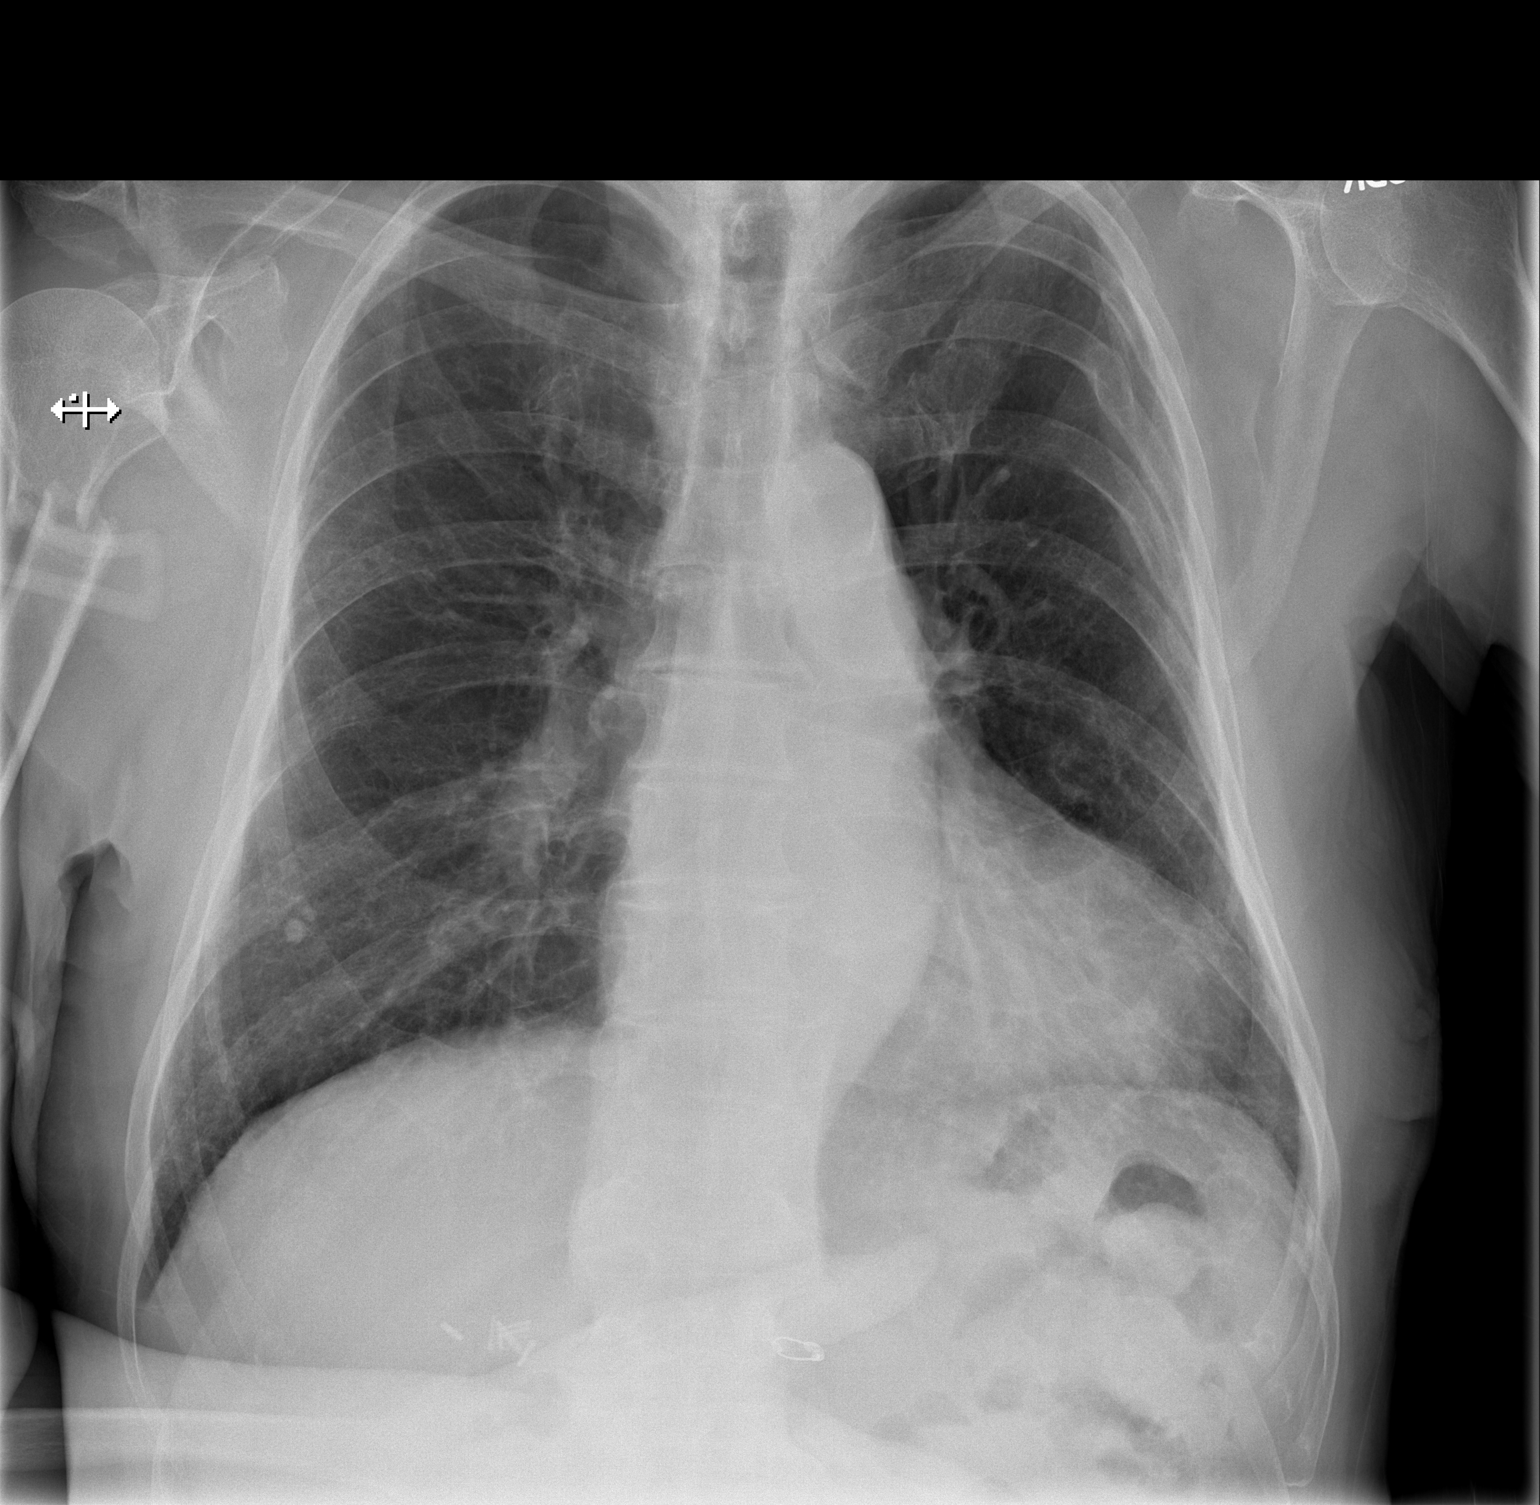

[w chest lat]
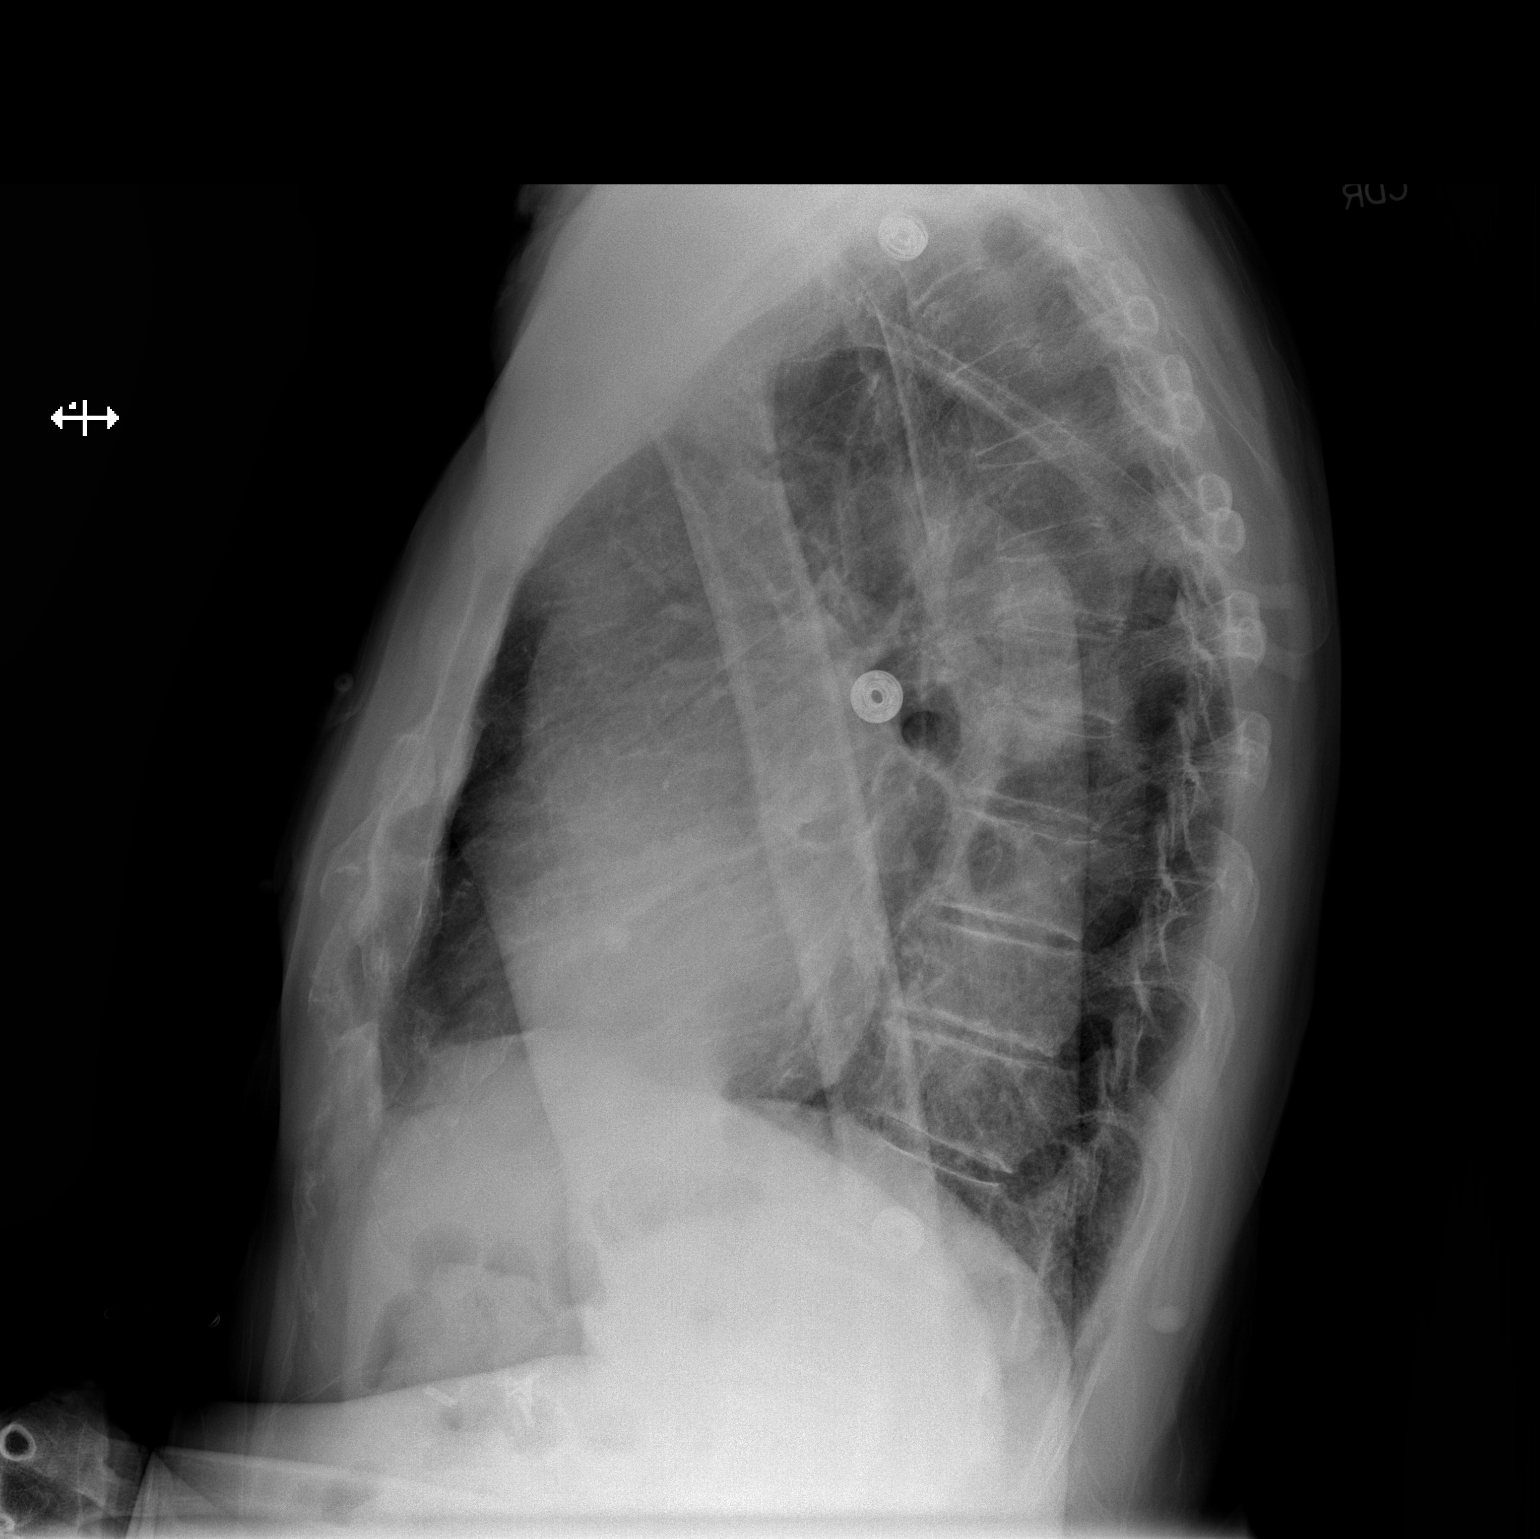

[2 of 2 positions shown; findings below may reference images not displayed]

FINDINGS: Borderline heart size with normal pulmonary vascularity.
Calcified and tortuous aorta.  Emphysematous changes and scattered
fibrosis in the lungs.  Calcified granuloma in the right midlung.
Old left rib fractures.  No focal airspace consolidation.  No
blunting of costophrenic angles.  No pneumothorax.  Fracture of the
proximal right humerus is demonstrated on the edge of the film.
Surgical clips in the right upper quadrant.  Degenerative changes
in the spine.
IMPRESSION: Emphysema and scattered fibrosis in the lungs.  No evidence of
active pulmonary disease.  Right proximal humeral fracture.

## 2015-01-17 IMAGING — US US RENAL
1 series · 14 of 25 positions shown · non-contrast
Comparison: CT 12/01/2012

CLINICAL DATA: Hydronephrosis.

RENAL/URINARY TRACT ULTRASOUND COMPLETE

[Series 1: us renal · 0.28mm/px · 14 of 77 slices shown]
[im 1/77]
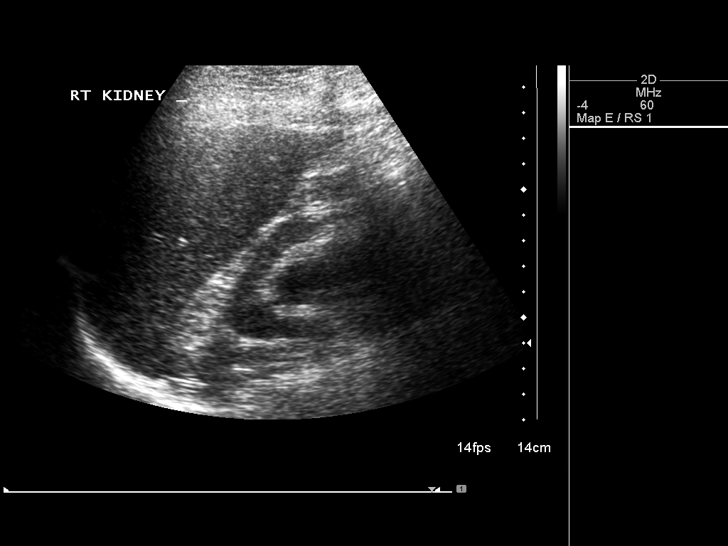
[im 7/77]
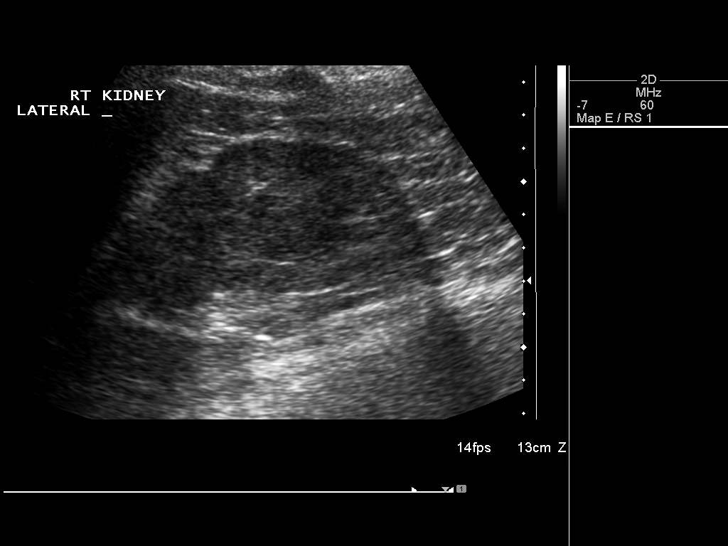
[im 13/77]
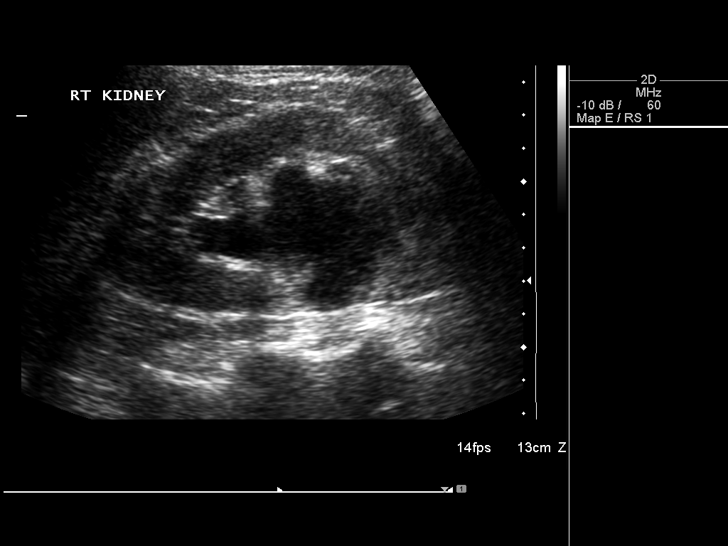
[im 20/77]
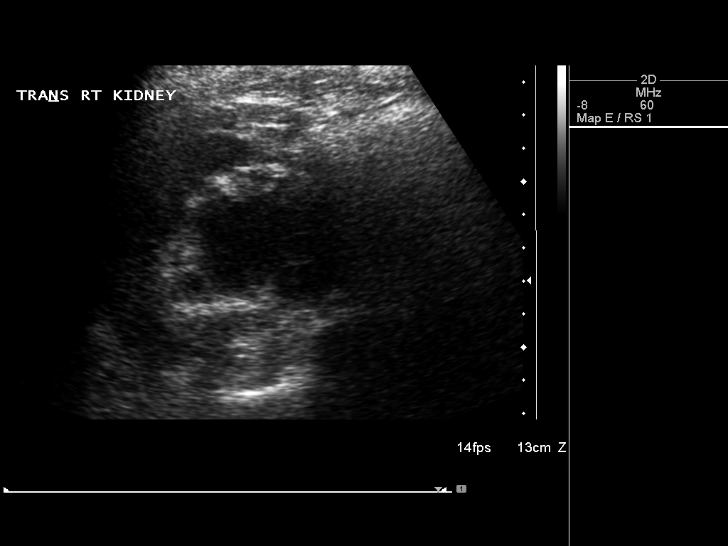
[im 26/77]
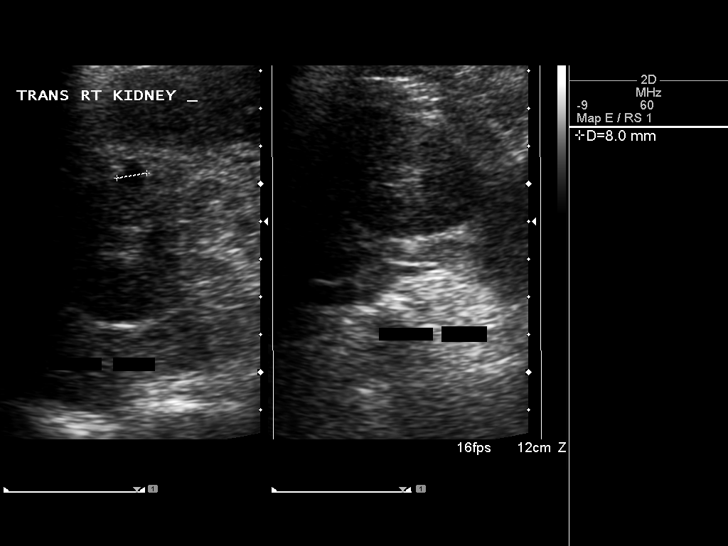
[im 29/77]
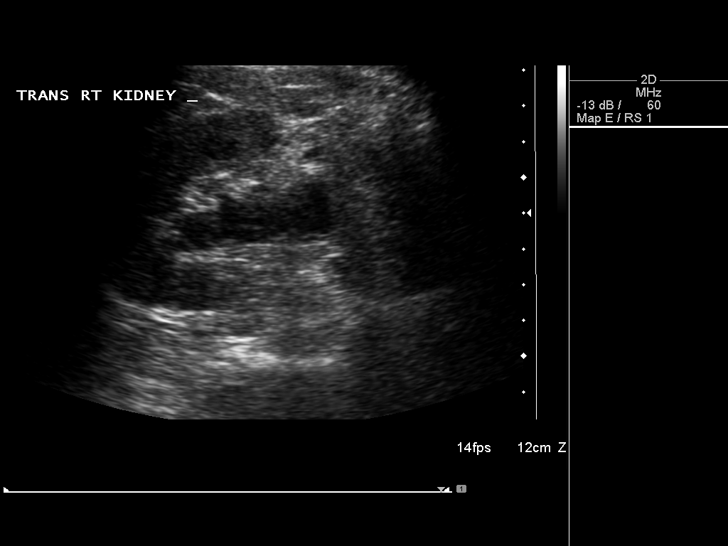
[im 35/77]
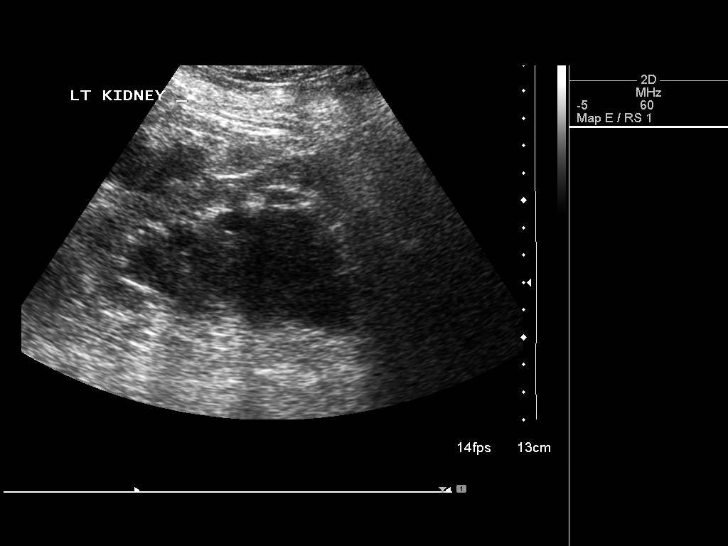
[im 42/77]
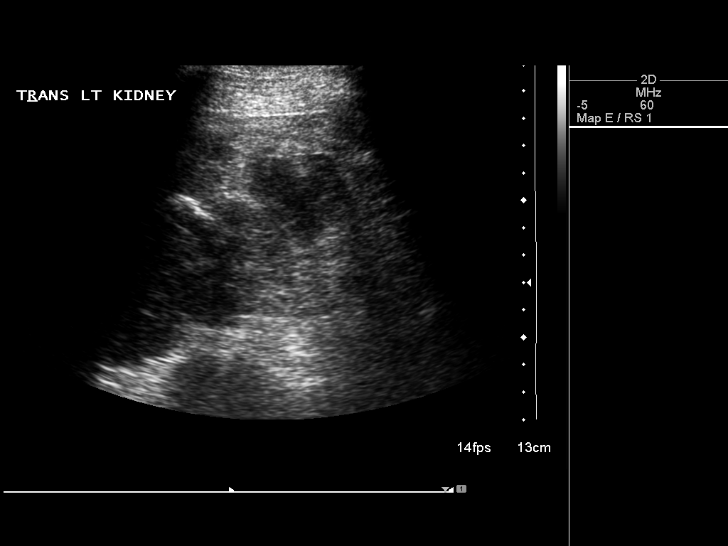
[im 48/77]
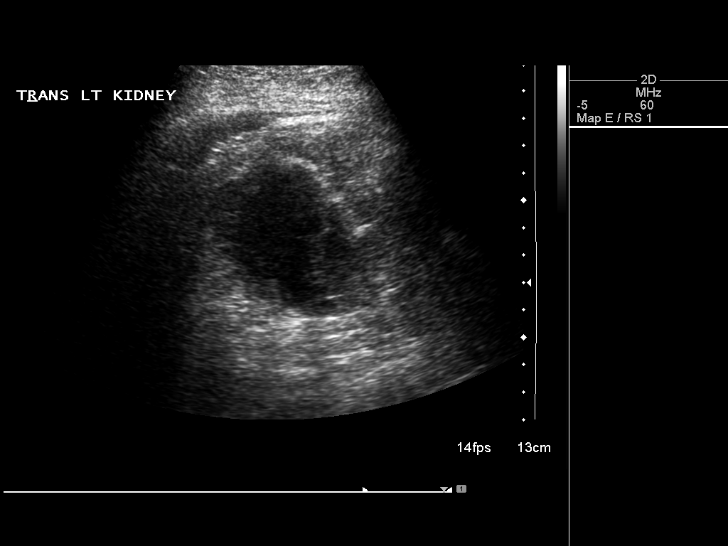
[im 51/77]
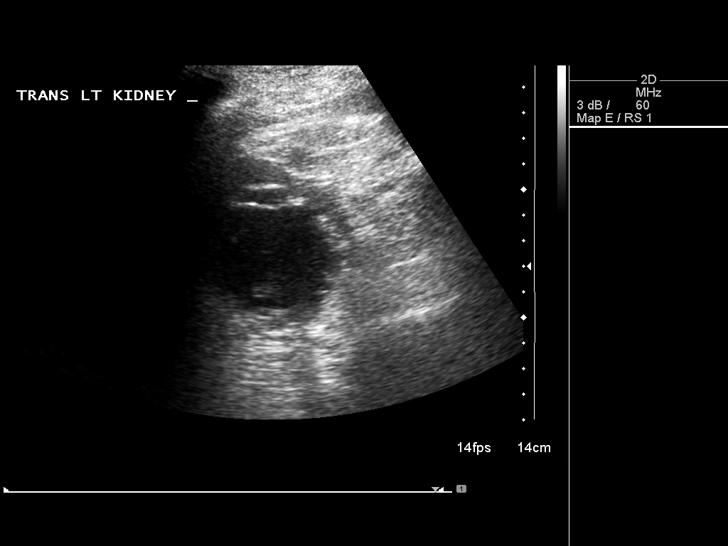
[im 58/77]
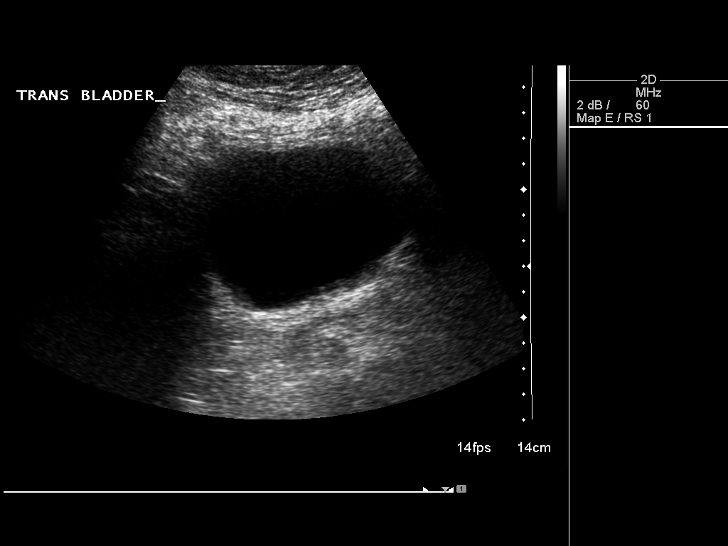
[im 64/77]
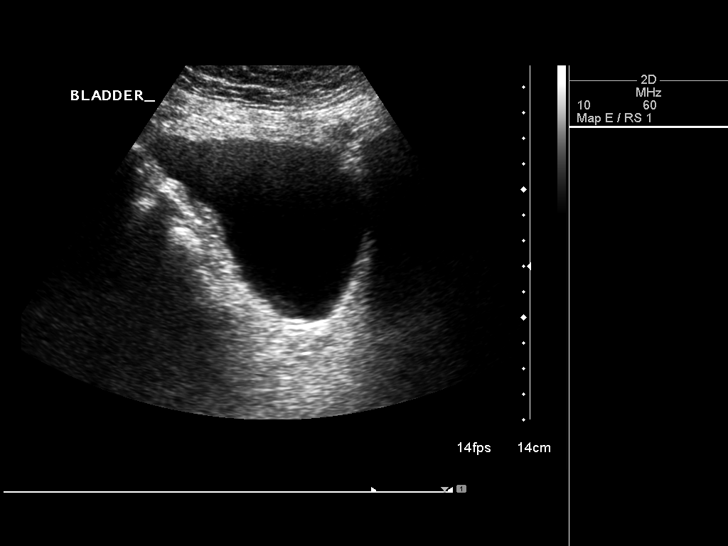
[im 70/77]
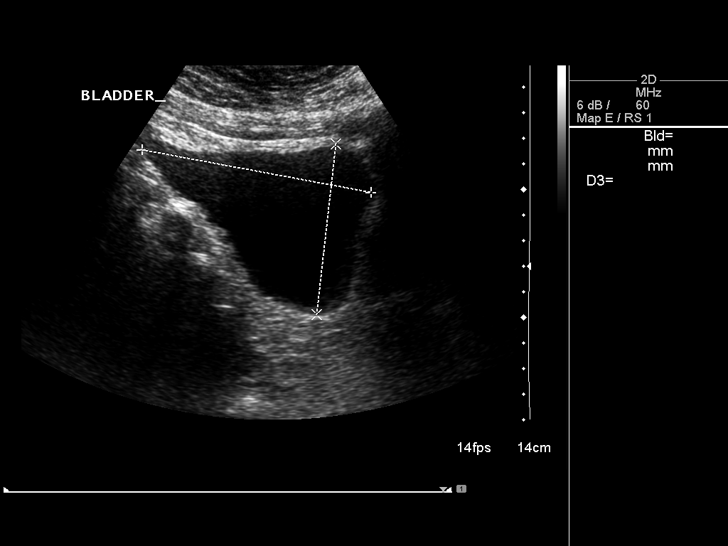
[im 77/77]
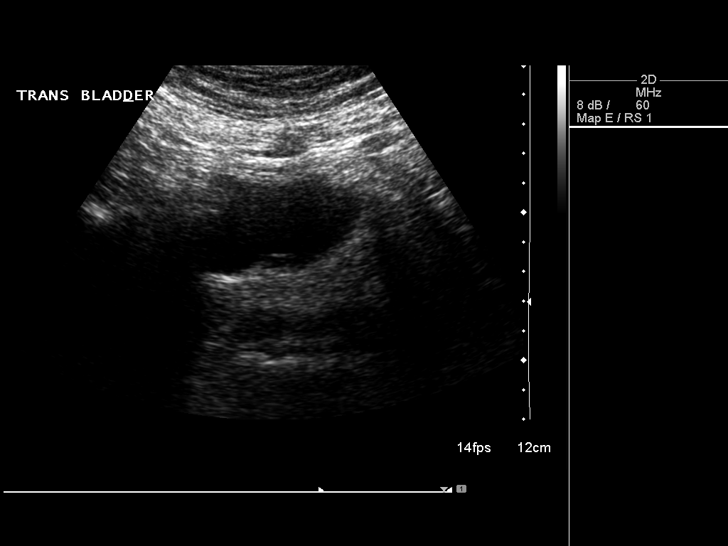

[14 of 25 positions shown; findings below may reference images not displayed]

FINDINGS: Right Kidney:  10.4 cm in craniocaudal length. Mild to moderate
right hydronephrosis.  Soft tissue renal sinus mass measures 4.9 x
5.9 x 4.3 cm as seen on prior CT.

Left Kidney:  11.8 cm in craniocaudal length. Mild to moderate left
hydronephrosis.  Left renal sinus hypoechoic mass measures 8.8 x
5.3 x 4.6 cm.  Left lateral upper pole hypoechoic mass measures
x 4.2 x 3.4 cm.

Bladder:  Normal bilateral ureteral jets.  Bladder wall grossly
unremarkable.
IMPRESSION: Bilateral large perirenal masses as seen on prior CT.  Mild to
moderate bilateral hydronephrosis.
